# Patient Record
Sex: Female | Born: 1985 | Race: White | Hispanic: Yes | Marital: Single | Smoking: Never smoker
Health system: Southern US, Community
[De-identification: ages and names within clinical notes are randomized; demographics above are authoritative.]

## PROBLEM LIST (undated history)

## (undated) DIAGNOSIS — Z789 Other specified health status: Secondary | ICD-10-CM

## (undated) DIAGNOSIS — R87629 Unspecified abnormal cytological findings in specimens from vagina: Secondary | ICD-10-CM

## (undated) HISTORY — PX: NO PAST SURGERIES: SHX2092

## (undated) HISTORY — DX: Unspecified abnormal cytological findings in specimens from vagina: R87.629

---

## 2009-05-08 ENCOUNTER — Emergency Department (HOSPITAL_COMMUNITY): Admission: EM | Admit: 2009-05-08 | Discharge: 2009-05-08 | Payer: Self-pay | Admitting: Emergency Medicine

## 2010-09-18 ENCOUNTER — Emergency Department (HOSPITAL_COMMUNITY): Admission: EM | Admit: 2010-09-18 | Discharge: 2010-09-18 | Payer: Self-pay | Admitting: Emergency Medicine

## 2012-04-25 ENCOUNTER — Encounter: Payer: Self-pay | Admitting: Family Medicine

## 2012-11-10 ENCOUNTER — Encounter (HOSPITAL_COMMUNITY): Payer: Self-pay | Admitting: *Deleted

## 2012-11-25 ENCOUNTER — Ambulatory Visit (HOSPITAL_COMMUNITY)
Admission: RE | Admit: 2012-11-25 | Discharge: 2012-11-25 | Disposition: A | Discharge status: Home / Self Care | Payer: Self-pay | Source: Ambulatory Visit | Attending: Obstetrics and Gynecology | Admitting: Obstetrics and Gynecology

## 2012-11-25 ENCOUNTER — Encounter (HOSPITAL_COMMUNITY): Payer: Self-pay

## 2012-11-25 ENCOUNTER — Other Ambulatory Visit: Payer: Self-pay | Admitting: Obstetrics and Gynecology

## 2012-11-25 VITALS — BP 118/76 | Temp 98.7°F | Ht 62.0 in | Wt 212.6 lb

## 2012-11-25 DIAGNOSIS — N63 Unspecified lump in unspecified breast: Secondary | ICD-10-CM

## 2012-11-25 DIAGNOSIS — N6322 Unspecified lump in the left breast, upper inner quadrant: Secondary | ICD-10-CM | POA: Insufficient documentation

## 2012-11-25 DIAGNOSIS — Z1239 Encounter for other screening for malignant neoplasm of breast: Secondary | ICD-10-CM

## 2012-11-25 NOTE — Progress Notes (Signed)
Patient referred to BCCCP by the [facility] Department due to left breast/axilla lump x 2 months.  Pap Smear:    Pap smear not performed today. Patients last Pap smear was 11/07/2012 at the [facility] Department and was patient has not received results yet. Patient has a history of an abnormal Pap smear 02/03/09 ASCUS HPV- and 09/20/2011 LSIL. 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 Pap smear resultd above is in EPIC under media. Will get last Pap smear result from [facility] Department.  Physical exam: Breasts Breasts symmetrical. No skin abnormalities bilateral breasts. No nipple retraction bilateral breasts. No nipple discharge bilateral breasts. No lymphadenopathy. No lumps palpated right breast. Palpated left breast lump at 10 o'clock 12 cm from the nipple at the border of the axilla. No complaints of pain or tenderness on exam. Patient referred to the [facility] for left breast ultrasound. Appointment scheduled for Wednesday, November 26, 2012 at 1300.    Pelvic/Bimanual No Pap smear completed today since last Pap smear was 11/07/2012. Pap smear not indicated per BCCCP guidelines.

## 2012-11-25 NOTE — Patient Instructions (Signed)
Taught patient how to perform BSE and gave educational materials to take home. Patient did not need a Pap smear today due to last Pap smear was 11/07/2012. Let patient know will get result of last Pap smear from the [facility] Department and follow up will be based on result. Patient referred to the [facility] for left breast ultrasound. Appointment scheduled for Wednesday, November 26, 2012 at 1300. Patient aware of appointment and will be there. Patient verbalized understanding.

## 2012-11-26 ENCOUNTER — Ambulatory Visit
Admission: RE | Admit: 2012-11-26 | Discharge: 2012-11-26 | Disposition: A | Discharge status: Home / Self Care | Payer: No Typology Code available for payment source | Source: Ambulatory Visit | Attending: Obstetrics and Gynecology | Admitting: Obstetrics and Gynecology

## 2012-11-26 DIAGNOSIS — N63 Unspecified lump in unspecified breast: Secondary | ICD-10-CM

## 2014-10-18 ENCOUNTER — Encounter (HOSPITAL_COMMUNITY): Payer: Self-pay

## 2020-12-06 ENCOUNTER — Encounter (HOSPITAL_COMMUNITY): Payer: Self-pay | Admitting: Physician Assistant

## 2020-12-06 ENCOUNTER — Other Ambulatory Visit: Payer: Self-pay

## 2020-12-06 ENCOUNTER — Emergency Department (HOSPITAL_COMMUNITY)
Admission: EM | Admit: 2020-12-06 | Discharge: 2020-12-07 | Disposition: A | Discharge status: Home / Self Care | Payer: HRSA Program | Attending: Emergency Medicine | Admitting: Emergency Medicine

## 2020-12-06 ENCOUNTER — Emergency Department (HOSPITAL_COMMUNITY): Payer: HRSA Program

## 2020-12-06 DIAGNOSIS — R0602 Shortness of breath: Secondary | ICD-10-CM | POA: Diagnosis present

## 2020-12-06 DIAGNOSIS — U071 COVID-19: Secondary | ICD-10-CM | POA: Insufficient documentation

## 2020-12-06 LAB — CBC
HCT: 42.2 % (ref 36.0–46.0)
Hemoglobin: 14.5 g/dL (ref 12.0–15.0)
MCH: 30.7 pg (ref 26.0–34.0)
MCHC: 34.4 g/dL (ref 30.0–36.0)
MCV: 89.4 fL (ref 80.0–100.0)
Platelets: 206 10*3/uL (ref 150–400)
RBC: 4.72 MIL/uL (ref 3.87–5.11)
RDW: 11.9 % (ref 11.5–15.5)
WBC: 3.2 10*3/uL — ABNORMAL LOW (ref 4.0–10.5)
nRBC: 0 % (ref 0.0–0.2)

## 2020-12-06 LAB — BASIC METABOLIC PANEL
Anion gap: 13 (ref 5–15)
BUN: 8 mg/dL (ref 6–20)
CO2: 23 mmol/L (ref 22–32)
Calcium: 8.5 mg/dL — ABNORMAL LOW (ref 8.9–10.3)
Chloride: 98 mmol/L (ref 98–111)
Creatinine, Ser: 0.66 mg/dL (ref 0.44–1.00)
GFR, Estimated: >60 mL/min (ref >60)
Glucose, Bld: 105 mg/dL — ABNORMAL HIGH (ref 70–99)
Potassium: 3.5 mmol/L (ref 3.5–5.1)
Sodium: 134 mmol/L — ABNORMAL LOW (ref 135–145)

## 2020-12-06 LAB — I-STAT BETA HCG BLOOD, ED (MC, WL, AP ONLY): I-stat hCG, quantitative: 7.9 m[IU]/mL — ABNORMAL HIGH (ref <5)

## 2020-12-06 MED ORDER — ALBUTEROL SULFATE HFA 108 (90 BASE) MCG/ACT IN AERS: 2.0000 | INHALATION_SPRAY | RESPIRATORY_TRACT | Status: DC | PRN

## 2020-12-06 MED ORDER — ALBUTEROL SULFATE HFA 108 (90 BASE) MCG/ACT IN AERS
6.0000 | INHALATION_SPRAY | Freq: Once | RESPIRATORY_TRACT | Status: AC
  Administered 2020-12-06: 6 via RESPIRATORY_TRACT
  Filled 2020-12-06: qty 6.7

## 2020-12-06 MED ORDER — ALBUTEROL SULFATE HFA 108 (90 BASE) MCG/ACT IN AERS: 2.0000 | INHALATION_SPRAY | Freq: Once | RESPIRATORY_TRACT | Status: DC

## 2020-12-06 MED ORDER — AEROCHAMBER PLUS FLO-VU LARGE MISC
1.0000 | Freq: Once | Status: AC
  Administered 2020-12-06: 1

## 2020-12-06 NOTE — ED Triage Notes (Signed)
Pt diagnosed with covid 11/28/20, symptomatic since 11/26/20, reports increasing SHOB/CP. States she feels like she "cannot breathe" since dx. NAD noted in triage, respirations even, unlabored, VSS.

## 2020-12-06 NOTE — ED Provider Notes (Signed)
[facility] EMERGENCY DEPARTMENT Provider Note   CSN: [id number] Arrival date & time: 12/06/20  1906     History Chief Complaint  Patient presents with  . Chest Pain  . Shortness of Breath    Taylor Cochran is a 34 y.o. female.  HPI   Pt is a 34 y/o female who presents to the ED today for eval of shortness of breath. She was dx with covid about 1 week ago and has been symptomatic for 11 days. She has had a cough and has been short of breath for the last 3 days. She reports chest pain to the anterior lower chest wall that is due to cough. Reports associated diarrhea, congestion, rhinorrhea. Denies vomiting.  She has been taking tylenol without relief. She has also been taking cough medication.   History reviewed. No pertinent past medical history.  There are no problems to display for this patient.   History reviewed. No pertinent surgical history.   OB History   No obstetric history on file.     History reviewed. No pertinent family history.     Home Medications Prior to Admission medications   Not on File    Allergies    Patient has no allergy information on record.  Review of Systems   Review of Systems  Constitutional: Negative for chills and fever.  HENT: Positive for congestion and rhinorrhea. Negative for ear pain and sore throat.   Eyes: Negative for pain and visual disturbance.  Respiratory: Positive for cough and shortness of breath.   Cardiovascular: Positive for chest pain. Negative for palpitations.  Gastrointestinal: Positive for diarrhea. Negative for abdominal pain, constipation, nausea and vomiting.  Genitourinary: Negative for dysuria and hematuria.  Musculoskeletal: Negative for back pain and myalgias.  Skin: Negative for color change and rash.  Neurological: Negative for syncope and headaches.  All other systems reviewed and are negative.   Physical Exam Updated Vital Signs BP 116/81 (BP Location: Right Arm)    Pulse 98   Temp 97.7 F (36.5 C) (Oral)   Resp (!) 25   SpO2 97%   Physical Exam Vitals and nursing note reviewed.  Constitutional:      General: She is not in acute distress.    Appearance: She is well-developed and well-nourished.  HENT:     Head: Normocephalic and atraumatic.  Eyes:     Conjunctiva/sclera: Conjunctivae normal.  Cardiovascular:     Rate and Rhythm: Normal rate and regular rhythm.     Heart sounds: No murmur heard.   Pulmonary:     Effort: Pulmonary effort is normal. No respiratory distress.     Breath sounds: Normal breath sounds. No decreased breath sounds, wheezing, rhonchi or rales.  Abdominal:     General: Bowel sounds are normal.     Palpations: Abdomen is soft.     Tenderness: There is no abdominal tenderness. There is no guarding or rebound.  Musculoskeletal:        General: No edema.     Cervical back: Neck supple.  Skin:    General: Skin is warm and dry.  Neurological:     Mental Status: She is alert.  Psychiatric:        Mood and Affect: Mood and affect normal.     ED Results / Procedures / Treatments   Labs (all labs ordered are listed, but only abnormal results are displayed) Labs Reviewed  BASIC METABOLIC PANEL - Abnormal; Notable for the following components:  Result Value   Sodium 134 (*)    Glucose, Bld 105 (*)    Calcium 8.5 (*)    All other components within normal limits  CBC - Abnormal; Notable for the following components:   WBC 3.2 (*)    All other components within normal limits  I-STAT BETA HCG BLOOD, ED (MC, WL, AP ONLY) - Abnormal; Notable for the following components:   I-stat hCG, quantitative 7.9 (*)    All other components within normal limits    EKG None  Radiology DG Chest 2 View  Result Date: 12/06/2020 CLINICAL DATA:  34 year old female with shortness of breath. EXAM: CHEST - 2 VIEW COMPARISON:  None FINDINGS: Faint bilateral lower lung field confluent opacities concerning for developing  infiltrate, likely viral or atypical in etiology. Clinical correlation is recommended. No focal consolidation, pleural effusion or pneumothorax. The cardiac silhouette is within limits. No acute osseous pathology. IMPRESSION: Findings concerning for developing infiltrate, likely viral or atypical in etiology. Electronically Signed   By: Elgie Collard M.D.   On: 12/06/2020 20:09    Procedures Procedures (including critical care time)  Medications Ordered in ED Medications  AeroChamber Plus Flo-Vu Large MISC 1 each (1 each Other Given 12/06/20 2330)  albuterol (VENTOLIN HFA) 108 (90 Base) MCG/ACT inhaler 6 puff (6 puffs Inhalation Given 12/06/20 2330)    ED Course  I have reviewed the triage vital signs and the nursing notes.  Pertinent labs & imaging results that were available during my care of the patient were reviewed by me and considered in my medical decision making (see chart for details).    MDM Rules/Calculators/A&P                          Patient presenting for evaluation for Covid.  Reports symptoms ongoing for 10 days and tested positive last week.  Patient nontoxic, well-appearing, no distress. She c/o shortness of breath but Vital signs are reassuring. I personally ambulated her in the ED and sats did not drop below 94% on RA.   Chest x-ray with likely viral infiltrate secondary to covid.  Beta hcg slightly positive, suspect false positive. Remainder of labs reassuring. Advised on quarantine measures. Will give Rx for symptomatic management. Advised on f/u and return precautions. Pt voiced understanding of the plan and reasons to return. All questions answered, pt stable for d/c.  Taylor Cochran was evaluated in Emergency Department on 12/06/2020 for the symptoms described in the history of present illness. She was evaluated in the context of the global COVID-19 pandemic, which necessitated consideration that the patient might be at risk for infection with the SARS-CoV-2  virus that causes COVID-19. Institutional protocols and algorithms that pertain to the evaluation of patients at risk for COVID-19 are in a state of rapid change based on information released by regulatory bodies including the CDC and federal and state organizations. These policies and algorithms were followed during the patient's care in the ED.    Final Clinical Impression(s) / ED Diagnoses Final diagnoses:  COVID    Rx / DC Orders ED Discharge Orders    None       Karrie Meres, PA-C 12/06/20 2339    Rozelle Logan, DO 12/07/20 1511

## 2020-12-06 NOTE — Discharge Instructions (Addendum)
Take two puffs of the albuterol inhaler every 4 hours as needed for cough, shortness of breath, or wheezing.   You should be isolated for at least 7 days since the onset of your symptoms AND >72 hours after symptoms resolution (absence of fever without the use of fever reducing medication and improvement in respiratory symptoms), whichever is longer  Please follow up with your primary care provider within 5-7 days for re-evaluation of your symptoms. If you do not have a primary care provider, information for a healthcare clinic has been provided for you to make arrangements for follow up care. Please return to the emergency department for any new or worsening symptoms including shortness of breath or chest pain.  

## 2020-12-07 NOTE — ED Notes (Signed)
DC insturctions and meds reviewed with pt. Pt verbalized understanding. Pt DC

## 2020-12-17 NOTE — L&D Delivery Note (Signed)
LABOR COURSE Patient presented to L&D on 9/26 for SROM. On arrival, cervical exam was 5/60/-1. She was initially managed expectantly. Pitocin was started at 1503. She progressed to complete at 1935.  Delivery Note Called to room and patient was complete and pushing. Head delivered OA. No nuchal cord present. Shoulder and body delivered in usual fashion and easily. At  1946 a healthy female was delivered via Vaginal, Spontaneous vertex presentation.  Infant with spontaneous cry, placed on mother's abdomen, dried and stimulated. Cord clamped x 2 after 1-2-minute delay, and cut by FOB. Cord blood drawn. Placenta delivered spontaneously with gentle cord traction. Appears intact. Fundus firm with massage and Pitocin. Labia, perineum, vagina, and cervix inspected with no tears/lacerations.    APGAR: 8, 9; weight pending .   Cord: 3VC with the following complications:None .    Anesthesia:  Epidural Episiotomy: None Lacerations: None Est. Blood Loss (mL): 50  Mom to postpartum.  Baby to Couplet care / Skin to Skin.  Alicia Amel, MD 09/11/21 8:00 PM

## 2021-01-18 ENCOUNTER — Inpatient Hospital Stay (HOSPITAL_COMMUNITY): Payer: Medicaid Other

## 2021-01-18 ENCOUNTER — Encounter (HOSPITAL_COMMUNITY): Payer: Self-pay | Admitting: Obstetrics and Gynecology

## 2021-01-18 ENCOUNTER — Inpatient Hospital Stay (HOSPITAL_COMMUNITY)
Admission: AD | Admit: 2021-01-18 | Discharge: 2021-01-18 | Disposition: A | Discharge status: Home / Self Care | Payer: Medicaid Other | Attending: Obstetrics and Gynecology | Admitting: Obstetrics and Gynecology

## 2021-01-18 ENCOUNTER — Other Ambulatory Visit: Payer: Self-pay

## 2021-01-18 DIAGNOSIS — O418X10 Other specified disorders of amniotic fluid and membranes, first trimester, not applicable or unspecified: Secondary | ICD-10-CM | POA: Diagnosis not present

## 2021-01-18 DIAGNOSIS — O99611 Diseases of the digestive system complicating pregnancy, first trimester: Secondary | ICD-10-CM | POA: Insufficient documentation

## 2021-01-18 DIAGNOSIS — O208 Other hemorrhage in early pregnancy: Secondary | ICD-10-CM | POA: Insufficient documentation

## 2021-01-18 DIAGNOSIS — Z3491 Encounter for supervision of normal pregnancy, unspecified, first trimester: Secondary | ICD-10-CM

## 2021-01-18 DIAGNOSIS — O09521 Supervision of elderly multigravida, first trimester: Secondary | ICD-10-CM | POA: Insufficient documentation

## 2021-01-18 DIAGNOSIS — K219 Gastro-esophageal reflux disease without esophagitis: Secondary | ICD-10-CM | POA: Insufficient documentation

## 2021-01-18 DIAGNOSIS — R102 Pelvic and perineal pain: Secondary | ICD-10-CM | POA: Diagnosis not present

## 2021-01-18 DIAGNOSIS — O26891 Other specified pregnancy related conditions, first trimester: Secondary | ICD-10-CM | POA: Insufficient documentation

## 2021-01-18 DIAGNOSIS — K625 Hemorrhage of anus and rectum: Secondary | ICD-10-CM | POA: Insufficient documentation

## 2021-01-18 DIAGNOSIS — Z3A01 Less than 8 weeks gestation of pregnancy: Secondary | ICD-10-CM | POA: Diagnosis not present

## 2021-01-18 DIAGNOSIS — O468X1 Other antepartum hemorrhage, first trimester: Secondary | ICD-10-CM

## 2021-01-18 DIAGNOSIS — R109 Unspecified abdominal pain: Secondary | ICD-10-CM

## 2021-01-18 DIAGNOSIS — R140 Abdominal distension (gaseous): Secondary | ICD-10-CM | POA: Diagnosis not present

## 2021-01-18 HISTORY — DX: Other specified health status: Z78.9

## 2021-01-18 LAB — URINALYSIS, ROUTINE W REFLEX MICROSCOPIC
Bilirubin Urine: NEGATIVE
Glucose, UA: NEGATIVE mg/dL
Hgb urine dipstick: NEGATIVE
Ketones, ur: NEGATIVE mg/dL
Leukocytes,Ua: NEGATIVE
Nitrite: NEGATIVE
Protein, ur: NEGATIVE mg/dL
Specific Gravity, Urine: 1.015 (ref 1.005–1.030)
pH: 6 (ref 5.0–8.0)

## 2021-01-18 LAB — WET PREP, GENITAL
Clue Cells Wet Prep HPF POC: NONE SEEN
Sperm: NONE SEEN
Trich, Wet Prep: NONE SEEN
Yeast Wet Prep HPF POC: NONE SEEN

## 2021-01-18 LAB — CBC
HCT: 37.4 % (ref 36.0–46.0)
Hemoglobin: 13.1 g/dL (ref 12.0–15.0)
MCH: 32.3 pg (ref 26.0–34.0)
MCHC: 35 g/dL (ref 30.0–36.0)
MCV: 92.1 fL (ref 80.0–100.0)
Platelets: 222 10*3/uL (ref 150–400)
RBC: 4.06 MIL/uL (ref 3.87–5.11)
RDW: 13.1 % (ref 11.5–15.5)
WBC: 7 10*3/uL (ref 4.0–10.5)
nRBC: 0 % (ref 0.0–0.2)

## 2021-01-18 LAB — POCT PREGNANCY, URINE: Preg Test, Ur: POSITIVE — AB

## 2021-01-18 LAB — ABO/RH: ABO/RH(D): O POS

## 2021-01-18 LAB — HCG, QUANTITATIVE, PREGNANCY: hCG, Beta Chain, Quant, S: 48832 m[IU]/mL — ABNORMAL HIGH (ref <5)

## 2021-01-18 NOTE — Discharge Instructions (Signed)
Safe Medications in Pregnancy   Acne: Benzoyl Peroxide Salicylic Acid  Backache/Headache: Tylenol: 2 regular strength every 4 hours OR              2 Extra strength every 6 hours  Colds/Coughs/Allergies: Benadryl (alcohol free) 25 mg every 6 hours as needed Breath right strips Claritin Cepacol throat lozenges Chloraseptic throat spray Cold-Eeze- up to three times per day Cough drops, alcohol free Flonase (by prescription only) Guaifenesin Mucinex Robitussin DM (plain only, alcohol free) Saline nasal spray/drops Sudafed (pseudoephedrine) & Actifed ** use only after [redacted] weeks gestation and if you do not have high blood pressure Tylenol Vicks Vaporub Zinc lozenges Zyrtec   Constipation: Colace Ducolax suppositories Fleet enema Glycerin suppositories Metamucil Milk of magnesia Miralax Senokot Smooth move tea  Diarrhea: Kaopectate Imodium A-D  *NO pepto Bismol  Hemorrhoids: Anusol Anusol HC Preparation H Tucks  Indigestion: Tums Maalox Mylanta Zantac  Pepcid  Insomnia: Benadryl (alcohol free) 25mg  every 6 hours as needed Tylenol PM Unisom, no Gelcaps  Leg Cramps: Tums MagGel  Nausea/Vomiting:  Bonine Dramamine Emetrol Ginger extract Sea bands Meclizine  Nausea medication to take during pregnancy:  Unisom (doxylamine succinate 25 mg tablets) Take one tablet daily at bedtime. If symptoms are not adequately controlled, the dose can be increased to a maximum recommended dose of two tablets daily (1/2 tablet in the morning, 1/2 tablet mid-afternoon and one at bedtime). Vitamin B6 100mg  tablets. Take one tablet twice a day (up to 200 mg per day).  Skin Rashes: Aveeno products Benadryl cream or 25mg  every 6 hours as needed Calamine Lotion 1% cortisone cream  Yeast infection: Gyne-lotrimin 7 Monistat 7  Gum/tooth pain: Anbesol  **If taking multiple medications, please check labels to avoid duplicating the same active ingredients **take  medication as directed on the label ** Do not exceed 4000 mg of tylenol in 24 hours **Do not take medications that contain aspirin or ibuprofen     [facility] for at [facility]  [address], [place], [facility] [address]  [phone]  Center for [facility] Healthcare at [facility]  [address], [place], [facility] [address]  [phone]  Center for [facility] Healthcare at [facility] [address] [place], [facility] [address]  [phone]  Center for [facility] Healthcare at [facility]  [address] [facility] [place], [address] [place]  [phone]  Center for [facility] Healthcare at [facility]  [address] (First floor), [place], [facility] [address]  [place]  Center for [facility] [phone], [place], [facility] [place] [phone]  Center for [facility] Healthcare at [facility]  [address] [place], [place], [address] [place]  [phone]  [facility]  [address], [place], [facility] [address]  [phone]  [facility]  [address] [place], [place], [address] [place]  [phone]  [facility] Ob[facility]  [address] [phone] [place], [address] [place]  [phone]  [facility]  [address], [place], [facility] [address]  [phone]  [facility]  [address], [place], [facility] [address]  [phone]  [facility] Department   [address] [phone] [place], [address] [place]  [phone]  [facility]  [address], [place], [phone] [address]   [phone]  [facility]  [address], [place], [facility]  [phone]  Constipation, Adult Constipation is when a person has trouble pooping (having a bowel movement). When you have this condition, you may poop fewer than 3 times a week. Your poop (stool) may also be dry, hard, or bigger than normal. Follow these instructions at  home: Eating and drinking  Eat foods that have a lot of fiber, such as: ? Fresh fruits and vegetables. ? Whole grains. ? Beans.  Eat less of foods that are low in fiber and high in fat and sugar, such as: ? [place] fries. ? Hamburgers. ? Cookies. ? Candy. ? Soda.  Drink enough fluid to keep your pee (urine) pale yellow.   General instructions  Exercise regularly or as told by your doctor. Try to do 150 minutes of exercise each week.  Go to the restroom when you feel like you need to poop. Do not hold it in.  Take over-the-counter and prescription medicines only as told by your doctor. These include any fiber supplements.  When you poop: ? Do deep breathing while relaxing your lower belly (abdomen). ? Relax your pelvic floor. The pelvic floor is a group of muscles that support the rectum, bladder, and intestines (as well as the uterus in women).  Watch your condition for any changes. Tell your doctor if you notice any.  Keep all follow-up visits as told by your doctor. This is important. Contact a doctor if:  You have pain that gets worse.  You have a fever.  You have not pooped for 4 days.  You vomit.  You are not hungry.  You lose weight.  You are bleeding from the opening of the butt (anus).  You have thin, pencil-like poop. Get help right away if:  You have a fever, and your symptoms suddenly get worse.  You leak poop or have blood in your poop.  Your belly feels hard or bigger than normal (bloated).  You have very bad belly pain.  You feel dizzy or you faint. Summary  Constipation is when a person poops fewer than 3 times a week, has trouble pooping, or has poop that is dry, hard, or bigger than normal.  Eat foods that have a lot of fiber.  Drink enough fluid to keep your pee (urine) pale yellow.  Take over-the-counter and prescription medicines only as told by your doctor. These include any fiber supplements. This information is not intended to  replace advice given to you by your health care provider. Make sure you discuss any questions you have with your health care provider. Document Revised: 10/21/2019 Document Reviewed: 10/21/2019 Elsevier Patient Education  2021 [employer].

## 2021-01-18 NOTE — MAU Provider Note (Signed)
History     CSN: [id number]  Arrival date and time: 01/18/21 1451   Event Date/Time   First Provider Initiated Contact with Patient 01/18/21 1601      Chief Complaint  Patient presents with  . Abdominal Pain  . Rectal bleeding   HPI Taylor Cochran is a 35 y.o. G3P2002 at [redacted]w[redacted]d who presents with abdominal pressure, pain, and rectal bleeding. Constipation for the last 4 days. Had some bright red rectal bleeding after & small hard bowel movement. Also reports some lower abdominal cramping & constant pressure. Rates pain 3/10. Hasn't treated symptoms. No vaginal bleeding. No fever/chills. No n/v.   OB History    Gravida  3   Para  2   Term  2   Preterm  0   AB  0   Living  2     SAB  0   IAB  0   Ectopic  0   Multiple      Live Births  2           Past Medical History:  Diagnosis Date  . No pertinent past medical history     Past Surgical History:  Procedure Laterality Date  . NO PAST SURGERIES      Family History  Problem Relation Age of Onset  . Diabetes Mother     Social History   Tobacco Use  . Smoking status: Never Smoker  . Smokeless tobacco: Never Used  Substance Use Topics  . Alcohol use: No  . Drug use: No    Allergies: No Known Allergies  No medications prior to admission.    Review of Systems  Constitutional: Negative.   Gastrointestinal: Positive for abdominal pain, anal bleeding and constipation. Negative for abdominal distention, blood in stool, diarrhea, nausea, rectal pain and vomiting.  Genitourinary: Negative.    Physical Exam   Blood pressure 123/74, pulse 69, temperature 99.1 F (37.3 C), temperature source Oral, resp. rate 16, height 5\' 2"  (1.575 m), weight 89.2 kg, last menstrual period 12/02/2020, SpO2 98 %.  Physical Exam Vitals and nursing note reviewed.  Constitutional:      General: She is not in acute distress.    Appearance: She is well-developed.  HENT:     Head: Normocephalic and atraumatic.   Pulmonary:     Effort: Pulmonary effort is normal. No respiratory distress.  Abdominal:     General: Abdomen is flat. There is no distension.     Palpations: Abdomen is soft.     Tenderness: There is no abdominal tenderness.  Genitourinary:    Vagina: No bleeding.     Rectum: No anal fissure or external hemorrhoid.  Neurological:     Mental Status: She is alert.     MAU Course  Procedures Results for orders placed or performed during the hospital encounter of 01/18/21 (from the past 24 hour(s))  Pregnancy, urine POC     Status: Abnormal   Collection Time: 01/18/21  3:05 PM  Result Value Ref Range   Preg Test, Ur POSITIVE (A) NEGATIVE  Urinalysis, Routine w reflex microscopic Urine, Clean Catch     Status: Abnormal   Collection Time: 01/18/21  3:15 PM  Result Value Ref Range   Color, Urine YELLOW YELLOW   APPearance HAZY (A) CLEAR   Specific Gravity, Urine 1.015 1.005 - 1.030   pH 6.0 5.0 - 8.0   Glucose, UA NEGATIVE NEGATIVE mg/dL   Hgb urine dipstick NEGATIVE NEGATIVE   Bilirubin Urine NEGATIVE NEGATIVE  Ketones, ur NEGATIVE NEGATIVE mg/dL   Protein, ur NEGATIVE NEGATIVE mg/dL   Nitrite NEGATIVE NEGATIVE   Leukocytes,Ua NEGATIVE NEGATIVE  CBC     Status: None   Collection Time: 01/18/21  4:34 PM  Result Value Ref Range   WBC 7.0 4.0 - 10.5 K/uL   RBC 4.06 3.87 - 5.11 MIL/uL   Hemoglobin 13.1 12.0 - 15.0 g/dL   HCT 16.1 09.6 - 04.5 %   MCV 92.1 80.0 - 100.0 fL   MCH 32.3 26.0 - 34.0 pg   MCHC 35.0 30.0 - 36.0 g/dL   RDW 40.9 81.1 - 91.4 %   Platelets 222 150 - 400 K/uL   nRBC 0.0 0.0 - 0.2 %  hCG, quantitative, pregnancy     Status: Abnormal   Collection Time: 01/18/21  4:34 PM  Result Value Ref Range   hCG, Beta Chain, Quant, S 78,295 (H) <5 mIU/mL  ABO/Rh     Status: None   Collection Time: 01/18/21  4:38 PM  Result Value Ref Range   ABO/RH(D) O POS    No rh immune globuloin      NOT A RH IMMUNE GLOBULIN CANDIDATE, PT RH POSITIVE Performed at [facility], [address], [place], [place] [address]   Wet prep, genital     Status: Abnormal   Collection Time: 01/18/21  4:39 PM   Specimen: PATH Cytology Cervicovaginal Ancillary Only  Result Value Ref Range   Yeast Wet Prep HPF POC NONE SEEN NONE SEEN   Trich, Wet Prep NONE SEEN NONE SEEN   Clue Cells Wet Prep HPF POC NONE SEEN NONE SEEN   WBC, Wet Prep HPF POC MODERATE (A) NONE SEEN   Sperm NONE SEEN    US OB LESS THAN 14 WEEKS WITH OB TRANSVAGINAL  Result Date: 01/18/2021 CLINICAL DATA:  Pain in early pregnancy. EXAM: OBSTETRIC <14 WK [place] AND TRANSVAGINAL OB US TECHNIQUE: Both transabdominal and transvaginal ultrasound examinations were performed for complete evaluation of the gestation as well as the maternal uterus, adnexal regions, and pelvic cul-de-sac. Transvaginal technique was performed to assess early pregnancy. COMPARISON:  None. FINDINGS: Intrauterine gestational sac: Single Yolk sac:  Visualized Embryo:  Visualize Cardiac Activity: Visualized Heart Rate: 111 bpm CRL:  5.2 mm   6 w   1 d                  [place] EDC: 09/12/2021 Maternal uterus/adnexae: Subchorionic hemorrhage: Small Right ovary: Normal Left ovary: Normal Other :None Free fluid:  None IMPRESSION: 1. Single living intrauterine gestation is visualized. The estimated gestational age is 6 weeks and 1 day. 2. Small subchorionic hemorrhage noted Electronically Signed   By: Signa Kell M.D.   On: 01/18/2021 18:18    MDM +UPT UA, wet prep, GC/chlamydia, CBC, ABO/Rh, quant hCG, and [place] today to rule out ectopic pregnancy which can be life threatening.   Ultrasound shows live IUP. Small subchorionic hemorrhage. Reviewed results with patient.   Rectal bleeding not currently occurring and benign exam; suspect internal hemorrhoid or small fissure related to constipation. Discussed reasons to seek further evaluation and discuss treatment of constipation.   Assessment and Plan   1. Normal IUP (intrauterine pregnancy) on prenatal  ultrasound, first trimester   2. Abdominal pain during pregnancy in first trimester   3. Subchorionic hematoma in first trimester, single or unspecified fetus   4. [redacted] weeks gestation of pregnancy    -start prenatal care -reviewed bleeding/misccarriage precautions -given list  of OTC meds safe in pregnancy -discussed treatment of constipaiton  Judeth Horn 01/18/2021, 7:01 PM

## 2021-01-18 NOTE — MAU Provider Note (Addendum)
Chief Complaint:  Abdominal Pain and Rectal bleeding   Event Date/Time   First Provider Initiated Contact with Patient 01/18/21 1601      HPI: Taylor Cochran is a 35 y.o. G3P2002 at [redacted]w[redacted]d by LMP who presents to maternity admissions reporting abdominal pain and rectal bleeding. Patient reports that she was constipated x4 days ago and when passing hard, "pebble-like" stools, noticed frank, bright red blood "as much as a period" coming from her rectum. The constipation has since resolved, but she is still having some, but less rectal bleeding. She was seen at an urgent care today and had a positive hemoccult test and was sent here. She also reports that two days ago she started experiencing abdominal pain, it was not sharp, but feels like pressure "bloating and gassy" and it has not resolved. The pain is most severe in the RLQ. She took and alka-seltzer tablet which helped some. She reports a hx of acid reflux which is controlled with Tums. She denies vaginal bleeding,  urinary symptoms, h/a, dizziness, n/v, or fever/chills.     Past Medical History: History reviewed. No pertinent past medical history.  Past obstetric history: OB History  Gravida Para Term Preterm AB Living  3 2 2  0 0 2  SAB IAB Ectopic Multiple Live Births  0 0 0   2    # Outcome Date GA Lbr Len/2nd Weight Sex Delivery Anes PTL Lv  3 Current           2 Term      Vag-Spont   LIV  1 Term      Vag-Spont   LIV    Past Surgical History: History reviewed. No pertinent surgical history.  Family History: Family History  Problem Relation Age of Onset   Diabetes Mother     Social History: Social History   Tobacco Use   Smoking status: Never Smoker   Smokeless tobacco: Never Used  Substance Use Topics   Alcohol use: No   Drug use: No    Allergies: No Known Allergies  Meds:  No medications prior to admission.    ROS:  Review of Systems  Constitutional: Negative for fever.  Gastrointestinal: Positive for  abdominal distention (bloating, gassy, pressure), abdominal pain, anal bleeding and blood in stool. Negative for nausea and vomiting.  Genitourinary: Negative for difficulty urinating and dysuria.    I have reviewed patient's Past Medical Hx, Surgical Hx, Family Hx, Social Hx, medications and allergies.   Physical Exam   Patient Vitals for the past 24 hrs:  BP Temp Temp src Pulse Resp SpO2 Height Weight  01/18/21 1523 123/74 99.1 F (37.3 C) Oral 69 16 98 % 5\' 2"  (1.575 m) 89.2 kg   Constitutional: Well-developed, well-nourished female in no acute distress.  Cardiovascular: normal rate Respiratory: normal effort GI: Abd soft, RLQ tenderness.  MS: Extremities nontender, no edema, normal ROM Neurologic: Alert and oriented x 4.     Labs: Results for orders placed or performed during the hospital encounter of 01/18/21 (from the past 24 hour(s))  Pregnancy, urine POC     Status: Abnormal   Collection Time: 01/18/21  3:05 PM  Result Value Ref Range   Preg Test, Ur POSITIVE (A) NEGATIVE  Urinalysis, Routine w reflex microscopic Urine, Clean Catch     Status: Abnormal   Collection Time: 01/18/21  3:15 PM  Result Value Ref Range   Color, Urine YELLOW YELLOW   APPearance HAZY (A) CLEAR   Specific Gravity, Urine 1.015  1.005 - 1.030   pH 6.0 5.0 - 8.0   Glucose, UA NEGATIVE NEGATIVE mg/dL   Hgb urine dipstick NEGATIVE NEGATIVE   Bilirubin Urine NEGATIVE NEGATIVE   Ketones, ur NEGATIVE NEGATIVE mg/dL   Protein, ur NEGATIVE NEGATIVE mg/dL   Nitrite NEGATIVE NEGATIVE   Leukocytes,Ua NEGATIVE NEGATIVE      Imaging:  US OB LESS THAN 14 WEEKS WITH OB TRANSVAGINAL  Result Date: 01/18/2021 CLINICAL DATA:  Pain in early pregnancy. EXAM: OBSTETRIC <14 WK [place] AND TRANSVAGINAL OB US TECHNIQUE: Both transabdominal and transvaginal ultrasound examinations were performed for complete evaluation of the gestation as well as the maternal uterus, adnexal regions, and pelvic cul-de-sac. Transvaginal  technique was performed to assess early pregnancy. COMPARISON:  None. FINDINGS: Intrauterine gestational sac: Single Yolk sac:  Visualized Embryo:  Visualize Cardiac Activity: Visualized Heart Rate: 111 bpm CRL:  5.2 mm   6 w   1 d                  [place] EDC: 09/12/2021 Maternal uterus/adnexae: Subchorionic hemorrhage: Small Right ovary: Normal Left ovary: Normal Other :None Free fluid:  None IMPRESSION: 1. Single living intrauterine gestation is visualized. The estimated gestational age is 6 weeks and 1 day. 2. Small subchorionic hemorrhage noted Electronically Signed   By: Signa Kell M.D.   On: 01/18/2021 18:18    MAU Course/MDM: Orders Placed This Encounter  Procedures   Wet prep, genital   US OB LESS THAN 14 WEEKS WITH OB TRANSVAGINAL   Urinalysis, Routine w reflex microscopic Urine, Clean Catch   CBC   hCG, quantitative, pregnancy   Diet NPO time specified   Pregnancy, urine POC   ABO/Rh    No orders of the defined types were placed in this encounter.    Assessment: 1. Normal IUP (intrauterine pregnancy) on prenatal ultrasound, first trimester   2. Abdominal pain during pregnancy in first trimester   3. Subchorionic hematoma in first trimester, single or unspecified fetus   4. [redacted] weeks gestation of pregnancy    [place] and labs reviewed IUP visualized with small subchorionic hemorrhage. Rectal bleeding likely due to internal hemorrhoids, stable H&H, patient educated on precautions for further evaluation. Other symptoms such as bloating and gas likely due to normal pregnancy in setting of normal exam. Patient educated on OTC medications safe to take during pregnancy for control of these symptoms.    Plan: Discharge home Patient educated on ED precautions and instructed to establish prenatal care and follow-up with outpatient OBGYN.    Allergies as of 01/18/2021   No Known Allergies      Medication List    You have not been prescribed any medications.     Ileana Roup,  Student-PA 01/18/2021 4:37 PM   I confirm that I have verified and agree with the information documented in the PA student's note.   Please see my note for full documentation of this encounter.  Judeth Horn, NP 01/18/2021 7:40 PM

## 2021-01-18 NOTE — MAU Note (Signed)
Taylor Cochran is a 35 y.o. at [redacted]w[redacted]d here in MAU reporting: for the past 4 days has been having rectal bleeding with bowel movements. States she has been constipated and since she was finally able to have a BM she has seen the bleeding. Also having abdominal pain that has been going on since Monday. Pain feels like pressure that is constant. No vaginal bleeding or discharge.  LMP: 12/02/20  Onset of complaint: ongoing  Pain score: 3/10  Vitals:   01/18/21 1523  BP: 123/74  Pulse: 69  Resp: 16  Temp: 99.1 F (37.3 C)  SpO2: 98%     Lab orders placed from triage: upt, ua

## 2021-01-20 LAB — GC/CHLAMYDIA PROBE AMP (~~LOC~~) NOT AT ARMC
Chlamydia: NEGATIVE
Comment: NEGATIVE
Comment: NORMAL
Neisseria Gonorrhea: NEGATIVE

## 2021-03-01 ENCOUNTER — Encounter: Payer: Self-pay | Admitting: Obstetrics & Gynecology

## 2021-03-01 ENCOUNTER — Other Ambulatory Visit (HOSPITAL_COMMUNITY)
Admission: RE | Admit: 2021-03-01 | Discharge: 2021-03-01 | Disposition: A | Discharge status: Home / Self Care | Payer: Medicaid Other | Source: Ambulatory Visit | Attending: Obstetrics & Gynecology | Admitting: Obstetrics & Gynecology

## 2021-03-01 ENCOUNTER — Ambulatory Visit (INDEPENDENT_AMBULATORY_CARE_PROVIDER_SITE_OTHER): Payer: Medicaid Other | Admitting: Obstetrics & Gynecology

## 2021-03-01 ENCOUNTER — Other Ambulatory Visit: Payer: Self-pay

## 2021-03-01 VITALS — BP 115/75 | HR 87 | Wt 199.0 lb

## 2021-03-01 DIAGNOSIS — Z3480 Encounter for supervision of other normal pregnancy, unspecified trimester: Secondary | ICD-10-CM | POA: Diagnosis not present

## 2021-03-01 DIAGNOSIS — Z3A12 12 weeks gestation of pregnancy: Secondary | ICD-10-CM

## 2021-03-01 DIAGNOSIS — O99210 Obesity complicating pregnancy, unspecified trimester: Secondary | ICD-10-CM

## 2021-03-01 DIAGNOSIS — O99891 Other specified diseases and conditions complicating pregnancy: Secondary | ICD-10-CM

## 2021-03-01 DIAGNOSIS — Z283 Underimmunization status: Secondary | ICD-10-CM

## 2021-03-01 DIAGNOSIS — O09529 Supervision of elderly multigravida, unspecified trimester: Secondary | ICD-10-CM

## 2021-03-01 DIAGNOSIS — Z2839 Other underimmunization status: Secondary | ICD-10-CM

## 2021-03-01 NOTE — Patient Instructions (Signed)
Pregnancy After Age 37 Women who become pregnant after the age of 37 have a higher risk for certain problems during pregnancy. This is because older women may already have health problems before becoming pregnant. Older women who are healthy before pregnancy may still develop problems during pregnancy. These problems may affect the mother, the unborn baby (fetus), or both. How does this affect me? If you are over age 37 and you want to become pregnant or are pregnant, you may have a higher risk of:  Not being able to get pregnant (infertility).  Going into labor early (preterm labor).  Needing surgical delivery of your baby (cesarean delivery, or C-section).  Having complications during pregnancy, such as high blood pressure and other symptoms (preeclampsia).  Having diabetes during pregnancy (gestational diabetes).  Being pregnant with more than one baby.  Loss of the unborn baby before 20 weeks (miscarriage) or after 20 weeks of pregnancy (stillbirth). How does this affect my baby? Babies born to women over the age of 37 have a higher risk for:  Being born early (prematurity).  Low birth weight, which is less than 5 lb, 8 oz (2.5 kg).  Birth defects, such as Down syndrome and cleft palate.  Health complications, including problems with growth and development. Prenatal care All women should see their health care provider before they try to become pregnant. This is especially important for women over the age of 37. Tell your health care provider about:  Any health problems you have.  Any medicines you take.  Any family history of health problems or chromosome-related defects.  Any problems you have had with past surgeries, pregnancies, or deliveries. If you are over age 37 and you plan to become pregnant, start taking a daily multivitamin a month or more before you try to get pregnant. Your multivitamin should contain 400 mcg (micrograms) of folic acid. If you are over age 37  and pregnant, make sure you:  Keep taking your multivitamin unless your health care provider tells you not to take it.  Keep all follow-up visits. This includes prenatal visits. This is important.  Talk with your health care provider about other prenatal screening tests that you may need. Prenatal tests Screening tests show whether your baby has a higher risk for birth defects than other babies. Screening tests include:  More frequent ultrasound tests to look for markers that indicate a risk for birth defects.  Maternal blood screening. These are blood tests that help to determine your baby's risk for birth defects. Screening tests do not show whether your baby has or does not have birth defects. They only show your baby's risk for certain birth defects. If your screening tests show that risk factors are present, you may need tests to confirm the birth defect (diagnostic testing). These tests may include:  Chorionic villus sampling. For this procedure, a tissue sample is taken from the organ that forms in your uterus to nourish your baby (placenta). The sample is removed through your cervix or abdomen and tested.  Amniocentesis. For this procedure, a small amount of the fluid that surrounds the baby in the uterus (amniotic fluid) is removed and tested. You will also need screenings tests for gestational diabetes in the first trimester, as well as later in pregnancy. Staying healthy during pregnancy Staying healthy during pregnancy can help you and your baby to have a lower risk for problems during pregnancy, during delivery, or both. Talk with your health care provider for specific instructions about staying healthy during   your pregnancy. General tips Nutrition  At each meal, eat a variety of foods from each of the five food groups. These groups include: ? Proteins such as lean meats, poultry, fish that is low in fat, beans, eggs, and nuts. ? Vegetables such as leafy greens, raw and cooked  vegetables, and vegetable juice. ? Fruits that are fresh, frozen, or canned, or 100% fruit juice. ? Dairy products such as low-fat yogurt, cheese, and milk. ? Whole grains including rice, cereal, pasta, and bread.  Follow instructions from your health care provider about eating and drinking restrictions during pregnancy. ? Do not eat raw eggs, raw meat, or raw fish or seafood. ? Do not eat any fish that contains high amounts of mercury, such as swordfish or mackerel.   Managing weight gain  Ask your health care provider how much weight gain is healthy during pregnancy.  Stay at a healthy weight. If needed, work with your health care provider to lose weight safely.  Exercise regularly, as directed by your health care provider. Ask your health care provider what forms of exercise are safe for you. Follow these instructions at home:  Do not use any products that contain nicotine or tobacco. These products include cigarettes, chewing tobacco, and vaping devices, such as e-cigarettes. If you need help quitting, ask your health care provider.  Drink enough fluid to keep your urine pale yellow.  Do not drink alcohol, use drugs, or abuse prescription medicine.  Take over-the-counter and prescription medicines only as told by your health care provider.  Do not use hot tubs, steam rooms, or saunas.  Talk with your health care provider about your risk of exposure to harmful environmental conditions. This includes exposure to chemicals, radiation, cleaning products, and cat feces. Follow advice from your health care provider about how to limit your exposure. Summary  Women who become pregnant after the age of 37 have a higher risk for complications during pregnancy.  Problems may affect the mother, the unborn baby (fetus), or both.  All women should see their health care provider before they try to become pregnant. This is especially important for women over the age of 37.  Staying healthy  during pregnancy can help both you and your baby to have a lower risk for some of the problems that can happen during pregnancy, during delivery, or both. This information is not intended to replace advice given to you by your health care provider. Make sure you discuss any questions you have with your health care provider. Document Revised: 08/22/2020 Document Reviewed: 08/22/2020 Elsevier Patient Education  2021 [employer]vier Inc.  

## 2021-03-01 NOTE — Progress Notes (Signed)
NOB, reports no problems today. 

## 2021-03-01 NOTE — Progress Notes (Signed)
  Subjective:    Taylor Cochran is a [id number] [redacted]w[redacted]d being seen today for her first obstetrical visit.  Her obstetrical history is significant for advanced maternal age and obesity. Patient does intend to breast feed. Pregnancy history fully reviewed.  Patient reports nausea.  Vitals:   03/01/21 1442  BP: 115/75  Pulse: 87  Weight: 199 lb (90.3 kg)    HISTORY: OB History  Gravida Para Term Preterm AB Living  3 2 2  0 0 2  SAB IAB Ectopic Multiple Live Births  0 0 0   2    # Outcome Date GA Lbr Len/2nd Weight Sex Delivery Anes PTL Lv  3 Current           2 Term 06/14/07 [redacted]w[redacted]d  7 lb 10 oz (3.459 kg)  Vag-Spont   LIV  1 Term      Vag-Spont   LIV   Past Medical History:  Diagnosis Date  . No pertinent past medical history   . Vaginal Pap smear, abnormal    Past Surgical History:  Procedure Laterality Date  . NO PAST SURGERIES     Family History  Problem Relation Age of Onset  . Diabetes Mother      Exam    Uterus:     Pelvic Exam:    Perineum: Hemorrhoids   Vulva: normal   Vagina:  normal mucosa   pH:     Cervix: no lesions   Adnexa: normal adnexa   Bony Pelvis: gynecoid  System: Breast:  normal appearance, no masses or tenderness   Skin: normal coloration and turgor, no rashes    Neurologic: oriented, normal mood   Extremities: normal strength, tone, and muscle mass   HEENT PERRLA and sclera clear, anicteric   Mouth/Teeth mucous membranes moist, pharynx normal without lesions   Neck supple   Cardiovascular: regular rate and rhythm   Respiratory:  appears well, vitals normal, no respiratory distress, acyanotic, normal RR, neck free of mass or lymphadenopathy   Abdomen: soft, non-tender; bowel sounds normal; no masses,  no organomegaly   Urinary: urethral meatus normal      Assessment:    Pregnancy: [redacted]w[redacted]d Patient Active Problem List   Diagnosis Date Noted  . Supervision of other normal pregnancy, antepartum 03/01/2021  . Antepartum multigravida  of advanced maternal age 26/16/2022  . Obesity affecting pregnancy, antepartum 03/01/2021  . Breast lump on left side at 10 o'clock position 11/25/2012        Plan:     Initial labs drawn. Prenatal vitamins. Problem list reviewed and updated. Genetic Screening discussed   Ultrasound discussed; fetal survey: ordered.  Follow up in 4 weeks. 50% of 30 min visit spent on counseling and coordination of care.     14/09/2012 03/01/2021

## 2021-03-02 LAB — CBC/D/PLT+RPR+RH+ABO+RUB AB...
Antibody Screen: NEGATIVE
Basophils Absolute: 0 10*3/uL (ref 0.0–0.2)
Basos: 1 %
EOS (ABSOLUTE): 0.1 10*3/uL (ref 0.0–0.4)
Eos: 1 %
HCV Ab: <0.1 s/co ratio (ref 0.0–0.9)
HIV Screen 4th Generation wRfx: NONREACTIVE
Hematocrit: 37.8 % (ref 34.0–46.6)
Hemoglobin: 13 g/dL (ref 11.1–15.9)
Hepatitis B Surface Ag: NEGATIVE
Immature Grans (Abs): 0 10*3/uL (ref 0.0–0.1)
Immature Granulocytes: 0 %
Lymphocytes Absolute: 2.1 10*3/uL (ref 0.7–3.1)
Lymphs: 32 %
MCH: 31.6 pg (ref 26.6–33.0)
MCHC: 34.4 g/dL (ref 31.5–35.7)
MCV: 92 fL (ref 79–97)
Monocytes Absolute: 0.4 10*3/uL (ref 0.1–0.9)
Monocytes: 6 %
Neutrophils Absolute: 4 10*3/uL (ref 1.4–7.0)
Neutrophils: 60 %
Platelets: 233 10*3/uL (ref 150–450)
RBC: 4.11 x10E6/uL (ref 3.77–5.28)
RDW: 12.7 % (ref 11.7–15.4)
RPR Ser Ql: NONREACTIVE
Rh Factor: POSITIVE
Rubella Antibodies, IGG: <0.9 index — ABNORMAL LOW (ref >0.99)
WBC: 6.7 10*3/uL (ref 3.4–10.8)

## 2021-03-02 LAB — HEMOGLOBIN A1C
Est. average glucose Bld gHb Est-mCnc: 100 mg/dL
Hgb A1c MFr Bld: 5.1 % (ref 4.8–5.6)

## 2021-03-02 LAB — HCV INTERPRETATION

## 2021-03-03 LAB — CULTURE, OB URINE

## 2021-03-03 LAB — URINE CULTURE, OB REFLEX

## 2021-03-06 DIAGNOSIS — O09899 Supervision of other high risk pregnancies, unspecified trimester: Secondary | ICD-10-CM | POA: Insufficient documentation

## 2021-03-06 DIAGNOSIS — Z2839 Other underimmunization status: Secondary | ICD-10-CM | POA: Insufficient documentation

## 2021-03-06 DIAGNOSIS — O99891 Other specified diseases and conditions complicating pregnancy: Secondary | ICD-10-CM | POA: Insufficient documentation

## 2021-03-06 LAB — CYTOLOGY - PAP
Comment: NEGATIVE
Comment: NEGATIVE
Diagnosis: UNDETERMINED — AB
HPV 16: NEGATIVE
HPV 18 / 45: NEGATIVE
High risk HPV: POSITIVE — AB

## 2021-03-07 ENCOUNTER — Encounter: Payer: Self-pay | Admitting: Obstetrics & Gynecology

## 2021-03-13 ENCOUNTER — Encounter: Payer: Self-pay | Admitting: Obstetrics & Gynecology

## 2021-03-13 ENCOUNTER — Other Ambulatory Visit: Payer: Self-pay

## 2021-03-13 ENCOUNTER — Telehealth: Payer: Self-pay

## 2021-03-13 MED ORDER — PREPLUS 27-1 MG PO TABS: 1.0000 | ORAL_TABLET | Freq: Every day | ORAL | 13 refills | Status: AC

## 2021-03-13 NOTE — Telephone Encounter (Signed)
Received called from patient she reports having a metallic taste with anything she eats or drinks. She would like to know if this is normal and what can she take to help.  I have advised patient the metallic taste should resolve on its on. Some things to try: Switch  tooth brush Chew on mints and continue to eat and drink as normal.  Drink sprite or ginger ale.  If symptoms get worse please call the office for an appointment.

## 2021-03-30 ENCOUNTER — Ambulatory Visit (INDEPENDENT_AMBULATORY_CARE_PROVIDER_SITE_OTHER): Payer: Medicaid Other | Admitting: Obstetrics and Gynecology

## 2021-03-30 ENCOUNTER — Encounter: Payer: Self-pay | Admitting: Obstetrics and Gynecology

## 2021-03-30 ENCOUNTER — Other Ambulatory Visit: Payer: Self-pay

## 2021-03-30 VITALS — BP 126/79 | HR 74 | Wt 196.0 lb

## 2021-03-30 DIAGNOSIS — O09529 Supervision of elderly multigravida, unspecified trimester: Secondary | ICD-10-CM

## 2021-03-30 DIAGNOSIS — Z3A16 16 weeks gestation of pregnancy: Secondary | ICD-10-CM

## 2021-03-30 DIAGNOSIS — Z2839 Other underimmunization status: Secondary | ICD-10-CM

## 2021-03-30 DIAGNOSIS — O99210 Obesity complicating pregnancy, unspecified trimester: Secondary | ICD-10-CM

## 2021-03-30 DIAGNOSIS — Z3480 Encounter for supervision of other normal pregnancy, unspecified trimester: Secondary | ICD-10-CM

## 2021-03-30 MED ORDER — ASPIRIN EC 81 MG PO TBEC: 81.0000 mg | DELAYED_RELEASE_TABLET | Freq: Every day | ORAL | 2 refills | Status: DC

## 2021-03-30 NOTE — Progress Notes (Signed)
Pt would like to discuss weight. Pt states that she is having issues with smells and taste of certain foods.  Pt states that this is making it hard to eat.

## 2021-03-30 NOTE — Progress Notes (Signed)
   PRENATAL VISIT NOTE  Subjective:  Taylor Cochran is a 35 y.o. G3P2002 at [redacted]w[redacted]d being seen today for ongoing prenatal care.  She is currently monitored for the following issues for this low-risk pregnancy and has Breast lump on left side at 10 o'clock position; Supervision of other normal pregnancy, antepartum; Antepartum multigravida of advanced maternal age; Obesity affecting pregnancy, antepartum; and Rubella non-immune status, antepartum on their problem list.  Patient reports having a hard time eating, everything tastes/smells bad, particularly since she had COVID. Worried about weight gain.  Contractions: Not present. Vag. Bleeding: None.  Movement: Present. Denies leaking of fluid.   The following portions of the patient's history were reviewed and updated as appropriate: allergies, current medications, past family history, past medical history, past social history, past surgical history and problem list.   Objective:   Vitals:   03/30/21 1619  BP: 126/79  Pulse: 74  Weight: 196 lb (88.9 kg)    Fetal Status: Fetal Heart Rate (bpm): 140   Movement: Present     General:  Alert, oriented and cooperative. Patient is in no acute distress.  Skin: Skin is warm and dry. No rash noted.   Cardiovascular: Normal heart rate noted  Respiratory: Normal respiratory effort, no problems with respiration noted  Abdomen: Soft, gravid, appropriate for gestational age.  Pain/Pressure: Absent     Pelvic: Cervical exam deferred        Extremities: Normal range of motion.     Mental Status: Normal mood and affect. Normal behavior. Normal judgment and thought content.   Assessment and Plan:  Pregnancy: G3P2002 at [redacted]w[redacted]d  1. Antepartum multigravida of advanced maternal age - Reviewed weight course as she is having a hard time eating - [phone], recommend she supplement with Ensure for calories but emphasized to just eat when she can and not stress, that most weight gain will come in 3rd  trimester  2. Obesity affecting pregnancy, antepartum Start baby ASA  3. Rubella non-immune status, antepartum MMR pp  4. Supervision of other normal pregnancy, antepartum  5. [redacted] weeks gestation of pregnancy   Preterm labor symptoms and general obstetric precautions including but not limited to vaginal bleeding, contractions, leaking of fluid and fetal movement were reviewed in detail with the patient. Please refer to After Visit Summary for other counseling recommendations.   Return in about 4 weeks (around 04/27/2021) for low OB, in person.  Future Appointments  Date Time Provider [facility]  04/14/2021  9:30 AM [facility] NURSE [facility]  04/14/2021  9:45 AM [facility]    Sloan Leiter, MD

## 2021-04-14 ENCOUNTER — Encounter: Payer: Self-pay | Admitting: *Deleted

## 2021-04-14 ENCOUNTER — Ambulatory Visit: Payer: Medicaid Other | Attending: Obstetrics & Gynecology

## 2021-04-14 ENCOUNTER — Other Ambulatory Visit: Payer: Self-pay | Admitting: *Deleted

## 2021-04-14 ENCOUNTER — Other Ambulatory Visit: Payer: Self-pay

## 2021-04-14 ENCOUNTER — Ambulatory Visit: Payer: Medicaid Other | Admitting: *Deleted

## 2021-04-14 DIAGNOSIS — Z6841 Body Mass Index (BMI) 40.0 and over, adult: Secondary | ICD-10-CM

## 2021-04-14 DIAGNOSIS — Z363 Encounter for antenatal screening for malformations: Secondary | ICD-10-CM

## 2021-04-14 DIAGNOSIS — Z3A19 19 weeks gestation of pregnancy: Secondary | ICD-10-CM | POA: Diagnosis not present

## 2021-04-14 DIAGNOSIS — Z6834 Body mass index (BMI) 34.0-34.9, adult: Secondary | ICD-10-CM | POA: Diagnosis not present

## 2021-04-14 DIAGNOSIS — O09522 Supervision of elderly multigravida, second trimester: Secondary | ICD-10-CM

## 2021-04-14 DIAGNOSIS — Z3480 Encounter for supervision of other normal pregnancy, unspecified trimester: Secondary | ICD-10-CM

## 2021-04-14 DIAGNOSIS — O99212 Obesity complicating pregnancy, second trimester: Secondary | ICD-10-CM

## 2021-04-28 ENCOUNTER — Encounter: Payer: Self-pay | Admitting: Obstetrics

## 2021-04-28 ENCOUNTER — Other Ambulatory Visit: Payer: Self-pay

## 2021-04-28 ENCOUNTER — Ambulatory Visit (INDEPENDENT_AMBULATORY_CARE_PROVIDER_SITE_OTHER): Payer: Medicaid Other | Admitting: Obstetrics

## 2021-04-28 VITALS — BP 124/79 | HR 84 | Wt 196.0 lb

## 2021-04-28 DIAGNOSIS — O99210 Obesity complicating pregnancy, unspecified trimester: Secondary | ICD-10-CM

## 2021-04-28 DIAGNOSIS — Z3480 Encounter for supervision of other normal pregnancy, unspecified trimester: Secondary | ICD-10-CM

## 2021-04-28 DIAGNOSIS — O09529 Supervision of elderly multigravida, unspecified trimester: Secondary | ICD-10-CM

## 2021-04-28 NOTE — Progress Notes (Signed)
Pt states she is doing well. 

## 2021-04-28 NOTE — Progress Notes (Signed)
Subjective:  Taylor Cochran is a 35 y.o. G3P2002 at [redacted]w[redacted]d being seen today for ongoing prenatal care.  She is currently monitored for the following issues for this low-risk pregnancy and has Breast lump on left side at 10 o'clock position; Supervision of other normal pregnancy, antepartum; Antepartum multigravida of advanced maternal age; Obesity affecting pregnancy, antepartum; and Rubella non-immune status, antepartum on their problem list.  Patient reports backache and heartburn.  Contractions: Not present. Vag. Bleeding: None.  Movement: Present. Denies leaking of fluid.   The following portions of the patient's history were reviewed and updated as appropriate: allergies, current medications, past family history, past medical history, past social history, past surgical history and problem list. Problem list updated.  Objective:   Vitals:   04/28/21 1012  BP: 124/79  Pulse: 84  Weight: 196 lb (88.9 kg)    Fetal Status:     Movement: Present     General:  Alert, oriented and cooperative. Patient is in no acute distress.  Skin: Skin is warm and dry. No rash noted.   Cardiovascular: Normal heart rate noted  Respiratory: Normal respiratory effort, no problems with respiration noted  Abdomen: Soft, gravid, appropriate for gestational age. Pain/Pressure: Absent     Pelvic:  Cervical exam deferred        Extremities: Normal range of motion.     Mental Status: Normal mood and affect. Normal behavior. Normal judgment and thought content.   Urinalysis:      Assessment and Plan:  Pregnancy: G3P2002 at [redacted]w[redacted]d  1. Supervision of other normal pregnancy, antepartum  2. Antepartum multigravida of advanced maternal age  59. Obesity affecting pregnancy, antepartum   Preterm labor symptoms and general obstetric precautions including but not limited to vaginal bleeding, contractions, leaking of fluid and fetal movement were reviewed in detail with the patient. Please refer to After Visit  Summary for other counseling recommendations.   Return in about 4 weeks (around 05/26/2021) for ROB.   Brock Bad, MD  04/28/21

## 2021-04-30 LAB — AFP, SERUM, OPEN SPINA BIFIDA
AFP MoM: 1.47
AFP Value: 85.1 ng/mL
Gest. Age on Collection Date: 21 weeks
Maternal Age At EDD: 37.1 yr
OSBR Risk 1 IN: 2965
Test Results:: NEGATIVE
Weight: 196 [lb_av]

## 2021-05-12 ENCOUNTER — Encounter: Payer: Self-pay | Admitting: *Deleted

## 2021-05-12 ENCOUNTER — Other Ambulatory Visit: Payer: Self-pay | Admitting: Obstetrics and Gynecology

## 2021-05-12 ENCOUNTER — Ambulatory Visit: Payer: Medicaid Other | Admitting: *Deleted

## 2021-05-12 ENCOUNTER — Other Ambulatory Visit: Payer: Self-pay

## 2021-05-12 ENCOUNTER — Ambulatory Visit: Payer: Medicaid Other | Attending: Obstetrics

## 2021-05-12 DIAGNOSIS — E669 Obesity, unspecified: Secondary | ICD-10-CM

## 2021-05-12 DIAGNOSIS — Z3480 Encounter for supervision of other normal pregnancy, unspecified trimester: Secondary | ICD-10-CM

## 2021-05-12 DIAGNOSIS — O09522 Supervision of elderly multigravida, second trimester: Secondary | ICD-10-CM

## 2021-05-12 DIAGNOSIS — Z3A23 23 weeks gestation of pregnancy: Secondary | ICD-10-CM | POA: Diagnosis not present

## 2021-05-12 DIAGNOSIS — O09523 Supervision of elderly multigravida, third trimester: Secondary | ICD-10-CM

## 2021-05-12 DIAGNOSIS — Z362 Encounter for other antenatal screening follow-up: Secondary | ICD-10-CM | POA: Diagnosis not present

## 2021-05-12 DIAGNOSIS — O99212 Obesity complicating pregnancy, second trimester: Secondary | ICD-10-CM | POA: Diagnosis present

## 2021-05-12 DIAGNOSIS — Z6841 Body Mass Index (BMI) 40.0 and over, adult: Secondary | ICD-10-CM

## 2021-05-26 ENCOUNTER — Other Ambulatory Visit: Payer: Self-pay

## 2021-05-26 ENCOUNTER — Ambulatory Visit (INDEPENDENT_AMBULATORY_CARE_PROVIDER_SITE_OTHER): Payer: Medicaid Other | Admitting: Family Medicine

## 2021-05-26 ENCOUNTER — Encounter: Payer: Self-pay | Admitting: Family Medicine

## 2021-05-26 VITALS — BP 109/71 | HR 85 | Wt 199.0 lb

## 2021-05-26 DIAGNOSIS — R87810 Cervical high risk human papillomavirus (HPV) DNA test positive: Secondary | ICD-10-CM | POA: Insufficient documentation

## 2021-05-26 DIAGNOSIS — Z3490 Encounter for supervision of normal pregnancy, unspecified, unspecified trimester: Secondary | ICD-10-CM

## 2021-05-26 DIAGNOSIS — Z3480 Encounter for supervision of other normal pregnancy, unspecified trimester: Secondary | ICD-10-CM

## 2021-05-26 DIAGNOSIS — O09529 Supervision of elderly multigravida, unspecified trimester: Secondary | ICD-10-CM

## 2021-05-26 DIAGNOSIS — O09899 Supervision of other high risk pregnancies, unspecified trimester: Secondary | ICD-10-CM

## 2021-05-26 DIAGNOSIS — R87610 Atypical squamous cells of undetermined significance on cytologic smear of cervix (ASC-US): Secondary | ICD-10-CM

## 2021-05-26 DIAGNOSIS — Z2839 Other underimmunization status: Secondary | ICD-10-CM

## 2021-05-26 DIAGNOSIS — O99210 Obesity complicating pregnancy, unspecified trimester: Secondary | ICD-10-CM

## 2021-05-26 NOTE — Progress Notes (Signed)
   Subjective:  Taylor Cochran is a 35 y.o. G3P2002 at [redacted]w[redacted]d being seen today for ongoing prenatal care.  She is currently monitored for the following issues for this low-risk pregnancy and has Breast lump on left side at 10 o'clock position; Supervision of other normal pregnancy, antepartum; Antepartum multigravida of advanced maternal age; Obesity affecting pregnancy, antepartum; Rubella non-immune status, antepartum; and ASCUS with positive high risk HPV cervical on their problem list.  Patient reports  allergy symptoms .  Contractions: Not present. Vag. Bleeding: None.  Movement: Present. Denies leaking of fluid.   The following portions of the patient's history were reviewed and updated as appropriate: allergies, current medications, past family history, past medical history, past social history, past surgical history and problem list. Problem list updated.  Objective:   Vitals:   05/26/21 1037  BP: 109/71  Pulse: 85  Weight: 199 lb (90.3 kg)    Fetal Status: Fetal Heart Rate (bpm): 152   Movement: Present     General:  Alert, oriented and cooperative. Patient is in no acute distress.  Skin: Skin is warm and dry. No rash noted.   Cardiovascular: Normal heart rate noted  Respiratory: Normal respiratory effort, no problems with respiration noted  Abdomen: Soft, gravid, appropriate for gestational age. Pain/Pressure: Absent     Pelvic: Vag. Bleeding: None     Cervical exam deferred        Extremities: Normal range of motion.  Edema: None  Mental Status: Normal mood and affect. Normal behavior. Normal judgment and thought content.   Urinalysis:      Assessment and Plan:  Pregnancy: G3P2002 at [redacted]w[redacted]d  1. Supervision of other normal pregnancy, antepartum BP and FHR normal Discussed contraception, still undecided Reminded of third trimester labs at next visit  2. Antepartum multigravida of advanced maternal age   57. Rubella non-immune status, antepartum MMR PP  4.  Obesity affecting pregnancy, antepartum   5. 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 Please refer to After Visit Summary for other counseling recommendations.  Return in 3 weeks (on 06/16/2021) for ob visit.   Clarnce Flock, MD

## 2021-05-26 NOTE — Patient Instructions (Signed)

## 2021-06-16 ENCOUNTER — Encounter: Payer: Self-pay | Admitting: Obstetrics and Gynecology

## 2021-06-16 ENCOUNTER — Other Ambulatory Visit: Payer: Self-pay

## 2021-06-16 ENCOUNTER — Ambulatory Visit (INDEPENDENT_AMBULATORY_CARE_PROVIDER_SITE_OTHER): Payer: Medicaid Other | Admitting: Obstetrics and Gynecology

## 2021-06-16 ENCOUNTER — Other Ambulatory Visit: Payer: Medicaid Other

## 2021-06-16 VITALS — BP 103/70 | HR 87 | Wt 201.0 lb

## 2021-06-16 DIAGNOSIS — Z3480 Encounter for supervision of other normal pregnancy, unspecified trimester: Secondary | ICD-10-CM

## 2021-06-16 DIAGNOSIS — Z2839 Other underimmunization status: Secondary | ICD-10-CM

## 2021-06-16 DIAGNOSIS — O99210 Obesity complicating pregnancy, unspecified trimester: Secondary | ICD-10-CM

## 2021-06-16 NOTE — Progress Notes (Signed)
   PRENATAL VISIT NOTE  Subjective:  Taylor Cochran is a 35 y.o. G3P2002 at [redacted]w[redacted]d being seen today for ongoing prenatal care.  She is currently monitored for the following issues for this high-risk pregnancy and has Breast lump on left side at 10 o'clock position; Supervision of other normal pregnancy, antepartum; Antepartum multigravida of advanced maternal age; Obesity affecting pregnancy, antepartum; Rubella non-immune status, antepartum; and ASCUS with positive high risk HPV cervical on their problem list.  Patient reports no complaints.  Contractions: Not present. Vag. Bleeding: None.  Movement: Present. Denies leaking of fluid.   The following portions of the patient's history were reviewed and updated as appropriate: allergies, current medications, past family history, past medical history, past social history, past surgical history and problem list.   Objective:   Vitals:   06/16/21 0824  BP: 103/70  Pulse: 87  Weight: 201 lb (91.2 kg)    Fetal Status: Fetal Heart Rate (bpm): 130 Fundal Height: 28 cm Movement: Present     General:  Alert, oriented and cooperative. Patient is in no acute distress.  Skin: Skin is warm and dry. No rash noted.   Cardiovascular: Normal heart rate noted  Respiratory: Normal respiratory effort, no problems with respiration noted  Abdomen: Soft, gravid, appropriate for gestational age.  Pain/Pressure: Present     Pelvic: Cervical exam deferred        Extremities: Normal range of motion.     Mental Status: Normal mood and affect. Normal behavior. Normal judgment and thought content.   Assessment and Plan:  Pregnancy: G3P2002 at [redacted]w[redacted]d 1. Supervision of other normal pregnancy, antepartum Patient is doing well without complaints Third trimester labs and glucola today Patient undecided on contraception (depo vs pills) - Glucose Tolerance, 2 Hours w/1 Hour - CBC - RPR - HIV Antibody (routine testing w rflx)  2. Obesity affecting pregnancy,  antepartum Continue ASA Follow up growth ultrasound scheduled  3. Rubella non-immune status, antepartum Will offer pp  Preterm labor symptoms and general obstetric precautions including but not limited to vaginal bleeding, contractions, leaking of fluid and fetal movement were reviewed in detail with the patient. Please refer to After Visit Summary for other counseling recommendations.   Return in about 2 weeks (around 06/30/2021) for in person, ROB, High risk.  Future Appointments  Date Time Provider Department Center  06/16/2021 10:45 AM Lillien Petronio, Gigi Gin, MD [facility] None  07/14/2021 10:00 AM [facility] NURSE [facility]  07/14/2021 10:15 AM [facility] US2 [facility]    Catalina Antigua, MD

## 2021-06-16 NOTE — Progress Notes (Signed)
Pt stats she is having some bleeding from gums and some pain.

## 2021-06-17 LAB — GLUCOSE TOLERANCE, 2 HOURS W/ 1HR
Glucose, 1 hour: 161 mg/dL (ref 65–179)
Glucose, 2 hour: 128 mg/dL (ref 65–152)
Glucose, Fasting: 81 mg/dL (ref 65–91)

## 2021-06-17 LAB — CBC
Hematocrit: 36.2 % (ref 34.0–46.6)
Hemoglobin: 12 g/dL (ref 11.1–15.9)
MCH: 31.1 pg (ref 26.6–33.0)
MCHC: 33.1 g/dL (ref 31.5–35.7)
MCV: 94 fL (ref 79–97)
Platelets: 188 10*3/uL (ref 150–450)
RBC: 3.86 x10E6/uL (ref 3.77–5.28)
RDW: 13.1 % (ref 11.7–15.4)
WBC: 5.8 10*3/uL (ref 3.4–10.8)

## 2021-06-17 LAB — RPR: RPR Ser Ql: NONREACTIVE

## 2021-06-17 LAB — HIV ANTIBODY (ROUTINE TESTING W REFLEX): HIV Screen 4th Generation wRfx: NONREACTIVE

## 2021-06-29 ENCOUNTER — Encounter: Payer: Self-pay | Admitting: Obstetrics and Gynecology

## 2021-06-29 ENCOUNTER — Other Ambulatory Visit: Payer: Self-pay

## 2021-06-29 ENCOUNTER — Ambulatory Visit (INDEPENDENT_AMBULATORY_CARE_PROVIDER_SITE_OTHER): Payer: Medicaid Other | Admitting: Obstetrics and Gynecology

## 2021-06-29 VITALS — BP 121/74 | HR 93 | Wt 203.0 lb

## 2021-06-29 DIAGNOSIS — O09899 Supervision of other high risk pregnancies, unspecified trimester: Secondary | ICD-10-CM

## 2021-06-29 DIAGNOSIS — Z2839 Other underimmunization status: Secondary | ICD-10-CM

## 2021-06-29 DIAGNOSIS — Z3480 Encounter for supervision of other normal pregnancy, unspecified trimester: Secondary | ICD-10-CM

## 2021-06-29 DIAGNOSIS — O99210 Obesity complicating pregnancy, unspecified trimester: Secondary | ICD-10-CM

## 2021-06-29 NOTE — Progress Notes (Signed)
+   Fetal movement. Pt states she is having increased round ligament pain.

## 2021-06-29 NOTE — Patient Instructions (Signed)

## 2021-06-29 NOTE — Progress Notes (Signed)
Subjective:  Taylor Cochran is a 35 y.o. G3P2002 at [redacted]w[redacted]d being seen today for ongoing prenatal care.  She is currently monitored for the following issues for this low-risk pregnancy and has Breast lump on left side at 10 o'clock position; Supervision of other normal pregnancy, antepartum; Antepartum multigravida of advanced maternal age; Obesity affecting pregnancy, antepartum; Rubella non-immune status, antepartum; and ASCUS with positive high risk HPV cervical on their problem list.  Patient reports general discomforts of pregnancy.  Contractions: Not present. Vag. Bleeding: None.  Movement: Present. Denies leaking of fluid.   The following portions of the patient's history were reviewed and updated as appropriate: allergies, current medications, past family history, past medical history, past social history, past surgical history and problem list. Problem list updated.  Objective:   Vitals:   06/29/21 1619  BP: 121/74  Pulse: 93  Weight: 203 lb (92.1 kg)    Fetal Status: Fetal Heart Rate (bpm): 136   Movement: Present     General:  Alert, oriented and cooperative. Patient is in no acute distress.  Skin: Skin is warm and dry. No rash noted.   Cardiovascular: Normal heart rate noted  Respiratory: Normal respiratory effort, no problems with respiration noted  Abdomen: Soft, gravid, appropriate for gestational age. Pain/Pressure: Present     Pelvic:  Cervical exam deferred        Extremities: Normal range of motion.  Edema: None  Mental Status: Normal mood and affect. Normal behavior. Normal judgment and thought content.   Urinalysis:      Assessment and Plan:  Pregnancy: G3P2002 at [redacted]w[redacted]d  1. Supervision of other normal pregnancy, antepartum Stable  2. Rubella non-immune status, antepartum Vaccine PP  3. Obesity affecting pregnancy, antepartum Growth scan 07/14/21  Preterm labor symptoms and general obstetric precautions including but not limited to vaginal bleeding,  contractions, leaking of fluid and fetal movement were reviewed in detail with the patient. Please refer to After Visit Summary for other counseling recommendations.  Return in about 2 weeks (around 07/13/2021) for OB visit, face to face, any provider.   Hermina Staggers, MD

## 2021-07-06 ENCOUNTER — Telehealth: Payer: Self-pay

## 2021-07-06 NOTE — Telephone Encounter (Signed)
Patient complains of having really bad pelvic pain that started yesterday. Patient states that she did not go to work today because of the pain. Patient advised to wear a maternity support belt to help with pain. Also advised to rest as much as possible. If pain worsens to call the office to schedule an appointment sooner. Advised if she has a decrease in fetal movement, contractions, LOF to go to [facility] for evaluation.

## 2021-07-13 ENCOUNTER — Ambulatory Visit (INDEPENDENT_AMBULATORY_CARE_PROVIDER_SITE_OTHER): Payer: Medicaid Other | Admitting: Obstetrics

## 2021-07-13 ENCOUNTER — Other Ambulatory Visit: Payer: Self-pay

## 2021-07-13 ENCOUNTER — Encounter: Payer: Self-pay | Admitting: Obstetrics

## 2021-07-13 VITALS — BP 115/70 | HR 94 | Wt 208.0 lb

## 2021-07-13 DIAGNOSIS — R87610 Atypical squamous cells of undetermined significance on cytologic smear of cervix (ASC-US): Secondary | ICD-10-CM

## 2021-07-13 DIAGNOSIS — O09899 Supervision of other high risk pregnancies, unspecified trimester: Secondary | ICD-10-CM

## 2021-07-13 DIAGNOSIS — O99210 Obesity complicating pregnancy, unspecified trimester: Secondary | ICD-10-CM

## 2021-07-13 DIAGNOSIS — O09529 Supervision of elderly multigravida, unspecified trimester: Secondary | ICD-10-CM

## 2021-07-13 DIAGNOSIS — Z23 Encounter for immunization: Secondary | ICD-10-CM

## 2021-07-13 DIAGNOSIS — Z3480 Encounter for supervision of other normal pregnancy, unspecified trimester: Secondary | ICD-10-CM

## 2021-07-13 DIAGNOSIS — O99213 Obesity complicating pregnancy, third trimester: Secondary | ICD-10-CM

## 2021-07-13 DIAGNOSIS — Z3A32 32 weeks gestation of pregnancy: Secondary | ICD-10-CM

## 2021-07-13 DIAGNOSIS — O09523 Supervision of elderly multigravida, third trimester: Secondary | ICD-10-CM

## 2021-07-13 DIAGNOSIS — R87810 Cervical high risk human papillomavirus (HPV) DNA test positive: Secondary | ICD-10-CM

## 2021-07-13 DIAGNOSIS — Z2839 Other underimmunization status: Secondary | ICD-10-CM

## 2021-07-13 NOTE — Progress Notes (Signed)
Pt presents for ROB without complaints today. TDAP given LD without difficulty

## 2021-07-13 NOTE — Progress Notes (Signed)
Subjective:  Taylor Cochran is a 35 y.o. G3P2002 at [redacted]w[redacted]d being seen today for ongoing prenatal care.  She is currently monitored for the following issues for this low-risk pregnancy and has Breast lump on left side at 10 o'clock position; Supervision of other normal pregnancy, antepartum; Antepartum multigravida of advanced maternal age; Obesity affecting pregnancy, antepartum; Rubella non-immune status, antepartum; and ASCUS with positive high risk HPV cervical on their problem list.  Patient reports no complaints.  Contractions: Not present. Vag. Bleeding: None.  Movement: Present. Denies leaking of fluid.   The following portions of the patient's history were reviewed and updated as appropriate: allergies, current medications, past family history, past medical history, past social history, past surgical history and problem list. Problem list updated.  Objective:   Vitals:   07/13/21 1539  BP: 115/70  Pulse: 94  Weight: 208 lb (94.3 kg)    Fetal Status:     Movement: Present     General:  Alert, oriented and cooperative. Patient is in no acute distress.  Skin: Skin is warm and dry. No rash noted.   Cardiovascular: Normal heart rate noted  Respiratory: Normal respiratory effort, no problems with respiration noted  Abdomen: Soft, gravid, appropriate for gestational age. Pain/Pressure: Present     Pelvic:  Cervical exam deferred        Extremities: Normal range of motion.  Edema: None  Mental Status: Normal mood and affect. Normal behavior. Normal judgment and thought content.   Urinalysis:      Assessment and Plan:  Pregnancy: G3P2002 at [redacted]w[redacted]d  1. Supervision of other normal pregnancy, antepartum  2. Antepartum multigravida of advanced maternal age  32. Rubella non-immune status, antepartum - rubella vaccination postpartum  4. Obesity affecting pregnancy, antepartum  5. ASCUS with positive high risk HPV cervical - colposcopy 3-4 months postpartum    There are no  diagnoses linked to this encounter. Preterm labor symptoms and general obstetric precautions including but not limited to vaginal bleeding, contractions, leaking of fluid and fetal movement were reviewed in detail with the patient. Please refer to After Visit Summary for other counseling recommendations.   Return in about 2 weeks (around 07/27/2021) for ROB.   Brock Bad, MD  07/13/21

## 2021-07-14 ENCOUNTER — Ambulatory Visit: Payer: Medicaid Other | Attending: Obstetrics and Gynecology

## 2021-07-14 ENCOUNTER — Ambulatory Visit: Payer: Medicaid Other | Admitting: *Deleted

## 2021-07-14 VITALS — BP 130/66 | HR 94

## 2021-07-14 DIAGNOSIS — O09523 Supervision of elderly multigravida, third trimester: Secondary | ICD-10-CM | POA: Diagnosis present

## 2021-07-14 DIAGNOSIS — Z3480 Encounter for supervision of other normal pregnancy, unspecified trimester: Secondary | ICD-10-CM

## 2021-07-14 DIAGNOSIS — Z3A32 32 weeks gestation of pregnancy: Secondary | ICD-10-CM | POA: Diagnosis not present

## 2021-07-14 DIAGNOSIS — Z362 Encounter for other antenatal screening follow-up: Secondary | ICD-10-CM | POA: Insufficient documentation

## 2021-07-14 DIAGNOSIS — O99213 Obesity complicating pregnancy, third trimester: Secondary | ICD-10-CM | POA: Diagnosis not present

## 2021-07-27 ENCOUNTER — Other Ambulatory Visit: Payer: Self-pay

## 2021-07-27 ENCOUNTER — Encounter: Payer: Self-pay | Admitting: Obstetrics and Gynecology

## 2021-07-27 ENCOUNTER — Ambulatory Visit (INDEPENDENT_AMBULATORY_CARE_PROVIDER_SITE_OTHER): Payer: Medicaid Other | Admitting: Obstetrics and Gynecology

## 2021-07-27 VITALS — BP 111/65 | HR 77 | Wt 206.2 lb

## 2021-07-27 DIAGNOSIS — Z2839 Other underimmunization status: Secondary | ICD-10-CM

## 2021-07-27 DIAGNOSIS — Z3480 Encounter for supervision of other normal pregnancy, unspecified trimester: Secondary | ICD-10-CM

## 2021-07-27 DIAGNOSIS — O09899 Supervision of other high risk pregnancies, unspecified trimester: Secondary | ICD-10-CM

## 2021-07-27 DIAGNOSIS — O99210 Obesity complicating pregnancy, unspecified trimester: Secondary | ICD-10-CM

## 2021-07-27 DIAGNOSIS — O09529 Supervision of elderly multigravida, unspecified trimester: Secondary | ICD-10-CM

## 2021-07-27 DIAGNOSIS — R87810 Cervical high risk human papillomavirus (HPV) DNA test positive: Secondary | ICD-10-CM

## 2021-07-27 DIAGNOSIS — R87610 Atypical squamous cells of undetermined significance on cytologic smear of cervix (ASC-US): Secondary | ICD-10-CM

## 2021-07-27 DIAGNOSIS — Z3A33 33 weeks gestation of pregnancy: Secondary | ICD-10-CM

## 2021-07-27 NOTE — Progress Notes (Signed)
Pt reports fetal movement, denies pain.  

## 2021-07-27 NOTE — Progress Notes (Signed)
   PRENATAL VISIT NOTE  Subjective:  Taylor Cochran is a 35 y.o. G3P2002 at [redacted]w[redacted]d being seen today for ongoing prenatal care.  She is currently monitored for the following issues for this low-risk pregnancy and has Breast lump on left side at 10 o'clock position; Supervision of other normal pregnancy, antepartum; Antepartum multigravida of advanced maternal age; Obesity affecting pregnancy, antepartum; Rubella non-immune status, antepartum; and ASCUS with positive high risk HPV cervical on their problem list.  Patient reports fatigue.  Contractions: Not present. Vag. Bleeding: None.  Movement: Present. Denies leaking of fluid.   The following portions of the patient's history were reviewed and updated as appropriate: allergies, current medications, past family history, past medical history, past social history, past surgical history and problem list.   Objective:   Vitals:   07/27/21 1122  BP: 111/65  Pulse: 77  Weight: 206 lb 3.2 oz (93.5 kg)   Fetal Status: Fetal Heart Rate (bpm): 146   Movement: Present     General:  Alert, oriented and cooperative. Patient is in no acute distress.  Skin: Skin is warm and dry. No rash noted.   Cardiovascular: Normal heart rate noted  Respiratory: Normal respiratory effort, no problems with respiration noted  Abdomen: Soft, gravid, appropriate for gestational age.  Pain/Pressure: Absent     Pelvic: Cervical exam deferred        Extremities: Normal range of motion.  Edema: Trace  Mental Status: Normal mood and affect. Normal behavior. Normal judgment and thought content.   Assessment and Plan:  Pregnancy: G3P2002 at [redacted]w[redacted]d  1. Supervision of other normal pregnancy, antepartum  2. Obesity affecting pregnancy, antepartum  3. Antepartum multigravida of advanced maternal age  51. ASCUS with positive high risk HPV cervical Colpo pp  5. Rubella non-immune status, antepartum MMR pp  6. [redacted] weeks gestation of pregnancy   Preterm labor  symptoms and general obstetric precautions including but not limited to vaginal bleeding, contractions, leaking of fluid and fetal movement were reviewed in detail with the patient. Please refer to After Visit Summary for other counseling recommendations.   Return in about 2 weeks (around 08/10/2021) for low OB, in person, 36 week swabs.  Future Appointments  Date Time Provider [place]  08/11/2021  9:35 AM Constant, Vickii Chafe, MD [facility] None    Sloan Leiter, MD

## 2021-07-27 NOTE — Patient Instructions (Signed)
Go to the [facility] [place] Address: [address], [place], [place] [address] Phone: [phone]  

## 2021-08-11 ENCOUNTER — Encounter: Payer: Self-pay | Admitting: Obstetrics and Gynecology

## 2021-08-11 ENCOUNTER — Other Ambulatory Visit (HOSPITAL_COMMUNITY)
Admission: RE | Admit: 2021-08-11 | Discharge: 2021-08-11 | Disposition: A | Discharge status: Home / Self Care | Payer: Medicaid Other | Source: Ambulatory Visit | Attending: Obstetrics and Gynecology | Admitting: Obstetrics and Gynecology

## 2021-08-11 ENCOUNTER — Ambulatory Visit (INDEPENDENT_AMBULATORY_CARE_PROVIDER_SITE_OTHER): Payer: Medicaid Other | Admitting: Obstetrics and Gynecology

## 2021-08-11 ENCOUNTER — Other Ambulatory Visit: Payer: Self-pay

## 2021-08-11 VITALS — BP 110/73 | HR 79 | Wt 207.0 lb

## 2021-08-11 DIAGNOSIS — Z3480 Encounter for supervision of other normal pregnancy, unspecified trimester: Secondary | ICD-10-CM

## 2021-08-11 DIAGNOSIS — O09529 Supervision of elderly multigravida, unspecified trimester: Secondary | ICD-10-CM

## 2021-08-11 DIAGNOSIS — O99210 Obesity complicating pregnancy, unspecified trimester: Secondary | ICD-10-CM

## 2021-08-11 NOTE — Progress Notes (Signed)
+   Fetal movement. No complaints. Cultures today.  

## 2021-08-11 NOTE — Progress Notes (Signed)
   PRENATAL VISIT NOTE  Subjective:  Taylor Cochran is a 35 y.o. G3P2002 at [redacted]w[redacted]d being seen today for ongoing prenatal care.  She is currently monitored for the following issues for this high-risk pregnancy and has Breast lump on left side at 10 o'clock position; Supervision of other normal pregnancy, antepartum; Antepartum multigravida of advanced maternal age; Obesity affecting pregnancy, antepartum; Rubella non-immune status, antepartum; and ASCUS with positive high risk HPV cervical on their problem list.  Patient reports no complaints.  Contractions: Not present. Vag. Bleeding: None.  Movement: Present. Denies leaking of fluid.   The following portions of the patient's history were reviewed and updated as appropriate: allergies, current medications, past family history, past medical history, past social history, past surgical history and problem list.   Objective:   Vitals:   08/11/21 0948  BP: 110/73  Pulse: 79  Weight: 207 lb (93.9 kg)    Fetal Status: Fetal Heart Rate (bpm): 138 Fundal Height: 36 cm Movement: Present  Presentation: Vertex  General:  Alert, oriented and cooperative. Patient is in no acute distress.  Skin: Skin is warm and dry. No rash noted.   Cardiovascular: Normal heart rate noted  Respiratory: Normal respiratory effort, no problems with respiration noted  Abdomen: Soft, gravid, appropriate for gestational age.  Pain/Pressure: Present     Pelvic: Cervical exam performed in the presence of a chaperone Dilation: 1 Effacement (%): Thick Station: -3  Extremities: Normal range of motion.  Edema: Trace  Mental Status: Normal mood and affect. Normal behavior. Normal judgment and thought content.   Assessment and Plan:  Pregnancy: G3P2002 at [redacted]w[redacted]d 1. Supervision of other normal pregnancy, antepartum Patient is doing well without complaints Plans pills for contraception and plans to use same pediatrician Cultures today  2. Obesity affecting pregnancy,  antepartum Continue ASA Follow up ultrasound scheduled  3. Antepartum multigravida of advanced maternal age   Preterm labor symptoms and general obstetric precautions including but not limited to vaginal bleeding, contractions, leaking of fluid and fetal movement were reviewed in detail with the patient. Please refer to After Visit Summary for other counseling recommendations.   Return in about 1 week (around 08/18/2021) for in person, ROB, High risk.  No future appointments.  Catalina Antigua, MD

## 2021-08-14 LAB — CERVICOVAGINAL ANCILLARY ONLY
Chlamydia: NEGATIVE
Comment: NEGATIVE
Comment: NORMAL
Neisseria Gonorrhea: NEGATIVE

## 2021-08-15 LAB — CULTURE, BETA STREP (GROUP B ONLY): Strep Gp B Culture: NEGATIVE

## 2021-08-16 ENCOUNTER — Other Ambulatory Visit: Payer: Self-pay

## 2021-08-16 ENCOUNTER — Encounter: Payer: Self-pay | Admitting: Obstetrics and Gynecology

## 2021-08-16 ENCOUNTER — Ambulatory Visit (INDEPENDENT_AMBULATORY_CARE_PROVIDER_SITE_OTHER): Payer: Medicaid Other | Admitting: Obstetrics and Gynecology

## 2021-08-16 VITALS — BP 119/82 | HR 90 | Wt 204.0 lb

## 2021-08-16 DIAGNOSIS — Z3480 Encounter for supervision of other normal pregnancy, unspecified trimester: Secondary | ICD-10-CM

## 2021-08-16 DIAGNOSIS — N6322 Unspecified lump in the left breast, upper inner quadrant: Secondary | ICD-10-CM

## 2021-08-16 DIAGNOSIS — O09899 Supervision of other high risk pregnancies, unspecified trimester: Secondary | ICD-10-CM

## 2021-08-16 DIAGNOSIS — Z3A36 36 weeks gestation of pregnancy: Secondary | ICD-10-CM

## 2021-08-16 DIAGNOSIS — O09529 Supervision of elderly multigravida, unspecified trimester: Secondary | ICD-10-CM

## 2021-08-16 DIAGNOSIS — O99210 Obesity complicating pregnancy, unspecified trimester: Secondary | ICD-10-CM

## 2021-08-16 DIAGNOSIS — Z2839 Other underimmunization status: Secondary | ICD-10-CM

## 2021-08-16 NOTE — Progress Notes (Signed)
ROB [redacted]w[redacted]d  GC/CT:08/11/21 Neg GBS:08/11/21 Neg  CC: None

## 2021-08-16 NOTE — Progress Notes (Signed)
   PRENATAL VISIT NOTE  Subjective:  Taylor Cochran is a 35 y.o. G3P2002 at [redacted]w[redacted]d being seen today for ongoing prenatal care.  She is currently monitored for the following issues for this low-risk pregnancy and has Breast lump on left side at 10 o'clock position; Supervision of other normal pregnancy, antepartum; Antepartum multigravida of advanced maternal age; Obesity affecting pregnancy, antepartum; Rubella non-immune status, antepartum; ASCUS with positive high risk HPV cervical; and [redacted] weeks gestation of pregnancy on their problem list.  Patient doing well with no acute concerns today. She reports no complaints.  Contractions: Not present. Vag. Bleeding: None.  Movement: Present. Denies leaking of fluid.   The following portions of the patient's history were reviewed and updated as appropriate: allergies, current medications, past family history, past medical history, past social history, past surgical history and problem list. Problem list updated.  Objective:   Vitals:   08/16/21 1130  BP: 119/82  Pulse: 90  Weight: 204 lb (92.5 kg)    Fetal Status: Fetal Heart Rate (bpm): 145 Fundal Height: 37 cm Movement: Present     General:  Alert, oriented and cooperative. Patient is in no acute distress.  Skin: Skin is warm and dry. No rash noted.   Cardiovascular: Normal heart rate noted  Respiratory: Normal respiratory effort, no problems with respiration noted  Abdomen: Soft, gravid, appropriate for gestational age.  Pain/Pressure: Absent     Pelvic: Cervical exam deferred        Extremities: Normal range of motion.  Edema: Trace  Mental Status:  Normal mood and affect. Normal behavior. Normal judgment and thought content.   Assessment and Plan:  Pregnancy: G3P2002 at [redacted]w[redacted]d  1. Supervision of other normal pregnancy, antepartum Continue routine care  2. [redacted] weeks gestation of pregnancy   3. Breast lump on left side at 10 o'clock position   4. Antepartum multigravida of  advanced maternal age Pt continues with baby ASA  5. Obesity affecting pregnancy, antepartum   6. Rubella non-immune status, antepartum Vaccinate after delivery  Preterm labor symptoms and general obstetric precautions including but not limited to vaginal bleeding, contractions, leaking of fluid and fetal movement were reviewed in detail with the patient.  Please refer to After Visit Summary for other counseling recommendations.   Return in about 1 week (around 08/23/2021) for ROB, in person.   Mariel Aloe, MD Faculty Attending Center for [facility]

## 2021-08-22 ENCOUNTER — Other Ambulatory Visit: Payer: Self-pay

## 2021-08-22 ENCOUNTER — Ambulatory Visit (INDEPENDENT_AMBULATORY_CARE_PROVIDER_SITE_OTHER): Payer: Medicaid Other | Admitting: Obstetrics & Gynecology

## 2021-08-22 DIAGNOSIS — Z3480 Encounter for supervision of other normal pregnancy, unspecified trimester: Secondary | ICD-10-CM

## 2021-08-22 NOTE — Progress Notes (Signed)
Pt reports fetal movement with some pressure. 

## 2021-08-22 NOTE — Progress Notes (Signed)
   PRENATAL VISIT NOTE  Subjective:  Taylor Cochran is a 35 y.o. G3P2002 at [redacted]w[redacted]d being seen today for ongoing prenatal care.  She is currently monitored for the following issues for this high-risk pregnancy and has Breast lump on left side at 10 o'clock position; Supervision of other normal pregnancy, antepartum; Antepartum multigravida of advanced maternal age; Obesity affecting pregnancy, antepartum; Rubella non-immune status, antepartum; ASCUS with positive high risk HPV cervical; and [redacted] weeks gestation of pregnancy on their problem list.  Patient reports occasional contractions.  Contractions: Not present. Vag. Bleeding: None.  Movement: Present. Denies leaking of fluid.   The following portions of the patient's history were reviewed and updated as appropriate: allergies, current medications, past family history, past medical history, past social history, past surgical history and problem list.   Objective:   Vitals:   08/22/21 1335  BP: 128/81  Pulse: 92  Weight: 208 lb 12.8 oz (94.7 kg)    Fetal Status: Fetal Heart Rate (bpm): 146 Fundal Height: 37 cm Movement: Present     General:  Alert, oriented and cooperative. Patient is in no acute distress.  Skin: Skin is warm and dry. No rash noted.   Cardiovascular: Normal heart rate noted  Respiratory: Normal respiratory effort, no problems with respiration noted  Abdomen: Soft, gravid, appropriate for gestational age.  Pain/Pressure: Present     Pelvic: Cervical exam deferred        Extremities: Normal range of motion.  Edema: Trace  Mental Status: Normal mood and affect. Normal behavior. Normal judgment and thought content.   Assessment and Plan:  Pregnancy: G3P2002 at [redacted]w[redacted]d 1. Supervision of other normal pregnancy, antepartum Doing well, GBS negative  Term labor symptoms and general obstetric precautions including but not limited to vaginal bleeding, contractions, leaking of fluid and fetal movement were reviewed in detail  with the patient. Please refer to After Visit Summary for other counseling recommendations.   Return in about 1 week (around 08/29/2021).  No future appointments.  Scheryl Darter, MD

## 2021-09-01 ENCOUNTER — Other Ambulatory Visit: Payer: Self-pay

## 2021-09-01 ENCOUNTER — Telehealth (HOSPITAL_COMMUNITY): Payer: Self-pay | Admitting: *Deleted

## 2021-09-01 ENCOUNTER — Ambulatory Visit (INDEPENDENT_AMBULATORY_CARE_PROVIDER_SITE_OTHER): Payer: Medicaid Other | Admitting: Obstetrics and Gynecology

## 2021-09-01 ENCOUNTER — Encounter (HOSPITAL_COMMUNITY): Payer: Self-pay | Admitting: *Deleted

## 2021-09-01 ENCOUNTER — Encounter (HOSPITAL_COMMUNITY): Payer: Self-pay

## 2021-09-01 VITALS — BP 111/71 | HR 92 | Wt 213.0 lb

## 2021-09-01 DIAGNOSIS — Z3480 Encounter for supervision of other normal pregnancy, unspecified trimester: Secondary | ICD-10-CM

## 2021-09-01 DIAGNOSIS — O09899 Supervision of other high risk pregnancies, unspecified trimester: Secondary | ICD-10-CM

## 2021-09-01 DIAGNOSIS — Z2839 Other underimmunization status: Secondary | ICD-10-CM

## 2021-09-01 DIAGNOSIS — O09529 Supervision of elderly multigravida, unspecified trimester: Secondary | ICD-10-CM

## 2021-09-01 DIAGNOSIS — O480 Post-term pregnancy: Secondary | ICD-10-CM

## 2021-09-01 DIAGNOSIS — Z3A39 39 weeks gestation of pregnancy: Secondary | ICD-10-CM | POA: Insufficient documentation

## 2021-09-01 NOTE — Telephone Encounter (Signed)
Preadmission screen  

## 2021-09-01 NOTE — Progress Notes (Signed)
+   Fetal movement. No complaints.  

## 2021-09-01 NOTE — Progress Notes (Signed)
   PRENATAL VISIT NOTE  Subjective:  Taylor Cochran is a 35 y.o. G3P2002 at [redacted]w[redacted]d being seen today for ongoing prenatal care.  She is currently monitored for the following issues for this low-risk pregnancy and has Breast lump on left side at 10 o'clock position; Supervision of other normal pregnancy, antepartum; Antepartum multigravida of advanced maternal age; Obesity affecting pregnancy, antepartum; Rubella non-immune status, antepartum; ASCUS with positive high risk HPV cervical; [redacted] weeks gestation of pregnancy; and [redacted] weeks gestation of pregnancy on their problem list.  Patient doing well with no acute concerns today. She reports no complaints.  Contractions: Irritability. Vag. Bleeding: None.  Movement: Present. Denies leaking of fluid.   The following portions of the patient's history were reviewed and updated as appropriate: allergies, current medications, past family history, past medical history, past social history, past surgical history and problem list. Problem list updated.  Objective:   Vitals:   09/01/21 1105  BP: 111/71  Pulse: 92  Weight: 213 lb (96.6 kg)    Fetal Status: Fetal Heart Rate (bpm): 131 Fundal Height: 38 cm Movement: Present  Presentation: Vertex  General:  Alert, oriented and cooperative. Patient is in no acute distress.  Skin: Skin is warm and dry. No rash noted.   Cardiovascular: Normal heart rate noted  Respiratory: Normal respiratory effort, no problems with respiration noted  Abdomen: Soft, gravid, appropriate for gestational age.  Pain/Pressure: Present     Pelvic: Cervical exam deferred Dilation: 2 Effacement (%): 70 Station: -3  Extremities: Normal range of motion.  Edema: Trace  Mental Status:  Normal mood and affect. Normal behavior. Normal judgment and thought content.   Assessment and Plan:  Pregnancy: G3P2002 at [redacted]w[redacted]d  1. [redacted] weeks gestation of pregnancy   2. Supervision of other normal pregnancy, antepartum Continue routine care,  IOL scheduled for 41 weeks, nst after 40 weeks  3. Rubella non-immune status, antepartum Treat after delivery  4. Antepartum multigravida of advanced maternal age   15. Post term pregnancy over 40 weeks  - US FETAL BPP W/NONSTRESS; Future  Term labor symptoms and general obstetric precautions including but not limited to vaginal bleeding, contractions, leaking of fluid and fetal movement were reviewed in detail with the patient.  Please refer to After Visit Summary for other counseling recommendations.   Return in about 1 week (around 09/08/2021) for ROB, in person.   Mariel Aloe, MD Faculty Attending Center for [facility]

## 2021-09-05 ENCOUNTER — Other Ambulatory Visit: Payer: Self-pay | Admitting: Advanced Practice Midwife

## 2021-09-07 ENCOUNTER — Telehealth: Payer: Self-pay

## 2021-09-07 ENCOUNTER — Encounter: Payer: Self-pay | Admitting: Obstetrics & Gynecology

## 2021-09-07 ENCOUNTER — Ambulatory Visit (INDEPENDENT_AMBULATORY_CARE_PROVIDER_SITE_OTHER): Payer: Medicaid Other | Admitting: Obstetrics & Gynecology

## 2021-09-07 VITALS — BP 123/76 | HR 90 | Wt 211.0 lb

## 2021-09-07 DIAGNOSIS — O09523 Supervision of elderly multigravida, third trimester: Secondary | ICD-10-CM

## 2021-09-07 DIAGNOSIS — Z3480 Encounter for supervision of other normal pregnancy, unspecified trimester: Secondary | ICD-10-CM

## 2021-09-07 NOTE — Telephone Encounter (Signed)
Pt had visit today with Dr. Debroah Loop and states when she went to the bathroom she noticed some blood and mucousy discharge on the toilet paper. States she is having increased pressure but no contractions. Pt did have cervical exam at her visit today. Baby is moving well and pt denies any leaking fluid. Advised patient this is normal after a cervical exam and could last up to 24 hours. Pt was given labor precautions and advised to monitor. She is agreeable to plan and verbalized understanding.

## 2021-09-07 NOTE — Progress Notes (Signed)
   PRENATAL VISIT NOTE  Subjective:  Taylor Cochran is a 35 y.o. G3P2002 at [redacted]w[redacted]d being seen today for ongoing prenatal care.  She is currently monitored for the following issues for this high-risk pregnancy and has Breast lump on left side at 10 o'clock position; Supervision of other normal pregnancy, antepartum; Antepartum multigravida of advanced maternal age; Obesity affecting pregnancy, antepartum; Rubella non-immune status, antepartum; ASCUS with positive high risk HPV cervical; [redacted] weeks gestation of pregnancy; and [redacted] weeks gestation of pregnancy on their problem list.  Patient reports occasional contractions.  Contractions: Not present. Vag. Bleeding: None.  Movement: Present. Denies leaking of fluid.   The following portions of the patient's history were reviewed and updated as appropriate: allergies, current medications, past family history, past medical history, past social history, past surgical history and problem list.   Objective:   Vitals:   09/07/21 1041  BP: 132/87  Pulse: 84  Weight: 211 lb (95.7 kg)    Fetal Status: Fetal Heart Rate (bpm): 133   Movement: Present     General:  Alert, oriented and cooperative. Patient is in no acute distress.  Skin: Skin is warm and dry. No rash noted.   Cardiovascular: Normal heart rate noted  Respiratory: Normal respiratory effort, no problems with respiration noted  Abdomen: Soft, gravid, appropriate for gestational age.  Pain/Pressure: Present     Pelvic: Cervical exam performed in the presence of a chaperone        Extremities: Normal range of motion.  Edema: Trace  IOL 41 weeks scheduled, NST in 4 days   2. AMA (advanced maternal age) multigravida 35+, third trimester   Term labor symptoms and general obstetric precautions including but not limited to vaginal bleeding, contractions, leaking of fluid and fetal movement were reviewed in detail with the patient. Please refer to After Visit Summary for other counseling  recommendations.   Return if symptoms worsen or fail to improve, for postpartum.  Future Appointments  Date Time Provider Department Center  09/11/2021  8:15 AM [facility] NST [facility]  09/15/2021  6:45 AM [facility] SCHED ROOM [facility] None    Scheryl Darter, MD

## 2021-09-11 ENCOUNTER — Ambulatory Visit (INDEPENDENT_AMBULATORY_CARE_PROVIDER_SITE_OTHER): Payer: Medicaid Other

## 2021-09-11 ENCOUNTER — Inpatient Hospital Stay (HOSPITAL_COMMUNITY): Payer: Medicaid Other | Admitting: Anesthesiology

## 2021-09-11 ENCOUNTER — Other Ambulatory Visit: Payer: Self-pay

## 2021-09-11 ENCOUNTER — Inpatient Hospital Stay (HOSPITAL_COMMUNITY)
Admission: AD | Admit: 2021-09-11 | Discharge: 2021-09-13 | DRG: 807 | Disposition: A | Discharge status: Home / Self Care | Payer: Medicaid Other | Attending: Obstetrics & Gynecology | Admitting: Obstetrics & Gynecology

## 2021-09-11 ENCOUNTER — Ambulatory Visit: Payer: Medicaid Other | Admitting: *Deleted

## 2021-09-11 ENCOUNTER — Encounter (HOSPITAL_COMMUNITY): Payer: Self-pay | Admitting: Family Medicine

## 2021-09-11 DIAGNOSIS — O4292 Full-term premature rupture of membranes, unspecified as to length of time between rupture and onset of labor: Secondary | ICD-10-CM | POA: Diagnosis present

## 2021-09-11 DIAGNOSIS — R030 Elevated blood-pressure reading, without diagnosis of hypertension: Secondary | ICD-10-CM | POA: Diagnosis present

## 2021-09-11 DIAGNOSIS — O480 Post-term pregnancy: Secondary | ICD-10-CM | POA: Diagnosis not present

## 2021-09-11 DIAGNOSIS — Z3A40 40 weeks gestation of pregnancy: Secondary | ICD-10-CM

## 2021-09-11 DIAGNOSIS — O99214 Obesity complicating childbirth: Secondary | ICD-10-CM | POA: Diagnosis present

## 2021-09-11 DIAGNOSIS — Z20822 Contact with and (suspected) exposure to covid-19: Secondary | ICD-10-CM | POA: Diagnosis present

## 2021-09-11 DIAGNOSIS — O26893 Other specified pregnancy related conditions, third trimester: Secondary | ICD-10-CM | POA: Diagnosis present

## 2021-09-11 DIAGNOSIS — O4202 Full-term premature rupture of membranes, onset of labor within 24 hours of rupture: Secondary | ICD-10-CM | POA: Diagnosis not present

## 2021-09-11 DIAGNOSIS — Z3480 Encounter for supervision of other normal pregnancy, unspecified trimester: Secondary | ICD-10-CM

## 2021-09-11 LAB — COMPREHENSIVE METABOLIC PANEL
ALT: 19 U/L (ref 0–44)
AST: 18 U/L (ref 15–41)
Albumin: 2.4 g/dL — ABNORMAL LOW (ref 3.5–5.0)
Alkaline Phosphatase: 280 U/L — ABNORMAL HIGH (ref 38–126)
Anion gap: 9 (ref 5–15)
BUN: 5 mg/dL — ABNORMAL LOW (ref 6–20)
CO2: 20 mmol/L — ABNORMAL LOW (ref 22–32)
Calcium: 8.5 mg/dL — ABNORMAL LOW (ref 8.9–10.3)
Chloride: 107 mmol/L (ref 98–111)
Creatinine, Ser: 0.48 mg/dL (ref 0.44–1.00)
GFR, Estimated: >60 mL/min (ref >60)
Glucose, Bld: 209 mg/dL — ABNORMAL HIGH (ref 70–99)
Potassium: 3.3 mmol/L — ABNORMAL LOW (ref 3.5–5.1)
Sodium: 136 mmol/L (ref 135–145)
Total Bilirubin: 0.8 mg/dL (ref 0.3–1.2)
Total Protein: 5.3 g/dL — ABNORMAL LOW (ref 6.5–8.1)

## 2021-09-11 LAB — TYPE AND SCREEN
ABO/RH(D): O POS
Antibody Screen: NEGATIVE

## 2021-09-11 LAB — CBC
HCT: 37.6 % (ref 36.0–46.0)
Hemoglobin: 12.5 g/dL (ref 12.0–15.0)
MCH: 30.6 pg (ref 26.0–34.0)
MCHC: 33.2 g/dL (ref 30.0–36.0)
MCV: 92.2 fL (ref 80.0–100.0)
Platelets: 189 10*3/uL (ref 150–400)
RBC: 4.08 MIL/uL (ref 3.87–5.11)
RDW: 12.9 % (ref 11.5–15.5)
WBC: 5.7 10*3/uL (ref 4.0–10.5)
nRBC: 0 % (ref 0.0–0.2)

## 2021-09-11 LAB — RESP PANEL BY RT-PCR (FLU A&B, COVID) ARPGX2
Influenza A by PCR: NEGATIVE
Influenza B by PCR: NEGATIVE
SARS Coronavirus 2 by RT PCR: NEGATIVE

## 2021-09-11 MED ORDER — BENZOCAINE-MENTHOL 20-0.5 % EX AERO: 1.0000 "application " | INHALATION_SPRAY | CUTANEOUS | Status: DC | PRN

## 2021-09-11 MED ORDER — ZOLPIDEM TARTRATE 5 MG PO TABS: 5.0000 mg | ORAL_TABLET | Freq: Every evening | ORAL | Status: DC | PRN

## 2021-09-11 MED ORDER — OXYTOCIN BOLUS FROM INFUSION
333.0000 mL | Freq: Once | INTRAVENOUS | Status: AC
  Administered 2021-09-11: 333 mL via INTRAVENOUS

## 2021-09-11 MED ORDER — IBUPROFEN 600 MG PO TABS
600.0000 mg | ORAL_TABLET | Freq: Four times a day (QID) | ORAL | Status: DC
  Administered 2021-09-12 – 2021-09-13 (×7): 600 mg via ORAL
  Filled 2021-09-11 (×7): qty 1

## 2021-09-11 MED ORDER — DIPHENHYDRAMINE HCL 50 MG/ML IJ SOLN
12.5000 mg | INTRAMUSCULAR | Status: DC | PRN
Start: 2021-09-11 — End: 2021-09-11

## 2021-09-11 MED ORDER — ONDANSETRON HCL 4 MG PO TABS: 4.0000 mg | ORAL_TABLET | ORAL | Status: DC | PRN

## 2021-09-11 MED ORDER — SIMETHICONE 80 MG PO CHEW: 80.0000 mg | CHEWABLE_TABLET | ORAL | Status: DC | PRN

## 2021-09-11 MED ORDER — LIDOCAINE HCL (PF) 1 % IJ SOLN
INTRAMUSCULAR | Status: DC | PRN
  Administered 2021-09-11: 3 mL via EPIDURAL
  Administered 2021-09-11: 5 mL via EPIDURAL

## 2021-09-11 MED ORDER — PHENYLEPHRINE 40 MCG/ML (10ML) SYRINGE FOR IV PUSH (FOR BLOOD PRESSURE SUPPORT): 80.0000 ug | PREFILLED_SYRINGE | INTRAVENOUS | Status: DC | PRN

## 2021-09-11 MED ORDER — OXYTOCIN-SODIUM CHLORIDE 30-0.9 UT/500ML-% IV SOLN
2.5000 [IU]/h | INTRAVENOUS | Status: DC
  Filled 2021-09-11: qty 500

## 2021-09-11 MED ORDER — LIDOCAINE HCL (PF) 1 % IJ SOLN: 30.0000 mL | INTRAMUSCULAR | Status: DC | PRN

## 2021-09-11 MED ORDER — EPHEDRINE 5 MG/ML INJ: 10.0000 mg | INTRAVENOUS | Status: DC | PRN

## 2021-09-11 MED ORDER — OXYTOCIN-SODIUM CHLORIDE 30-0.9 UT/500ML-% IV SOLN
1.0000 m[IU]/min | INTRAVENOUS | Status: DC
  Administered 2021-09-11: 2 m[IU]/min via INTRAVENOUS

## 2021-09-11 MED ORDER — LACTATED RINGERS IV SOLN: 500.0000 mL | Freq: Once | INTRAVENOUS | Status: DC

## 2021-09-11 MED ORDER — PRENATAL MULTIVITAMIN CH
1.0000 | ORAL_TABLET | Freq: Every day | ORAL | Status: DC
  Administered 2021-09-12 – 2021-09-13 (×2): 1 via ORAL
  Filled 2021-09-11 (×2): qty 1

## 2021-09-11 MED ORDER — ONDANSETRON HCL 4 MG/2ML IJ SOLN: 4.0000 mg | Freq: Four times a day (QID) | INTRAMUSCULAR | Status: DC | PRN

## 2021-09-11 MED ORDER — TERBUTALINE SULFATE 1 MG/ML IJ SOLN: 0.2500 mg | Freq: Once | INTRAMUSCULAR | Status: DC | PRN

## 2021-09-11 MED ORDER — SENNOSIDES-DOCUSATE SODIUM 8.6-50 MG PO TABS
2.0000 | ORAL_TABLET | Freq: Every day | ORAL | Status: DC
  Administered 2021-09-12 – 2021-09-13 (×2): 2 via ORAL
  Filled 2021-09-11 (×2): qty 2

## 2021-09-11 MED ORDER — WITCH HAZEL-GLYCERIN EX PADS: 1.0000 "application " | MEDICATED_PAD | CUTANEOUS | Status: DC | PRN

## 2021-09-11 MED ORDER — ACETAMINOPHEN 325 MG PO TABS: 650.0000 mg | ORAL_TABLET | ORAL | Status: DC | PRN

## 2021-09-11 MED ORDER — ONDANSETRON HCL 4 MG/2ML IJ SOLN: 4.0000 mg | INTRAMUSCULAR | Status: DC | PRN

## 2021-09-11 MED ORDER — ACETAMINOPHEN 325 MG PO TABS
650.0000 mg | ORAL_TABLET | ORAL | Status: DC | PRN
  Administered 2021-09-12 (×2): 650 mg via ORAL
  Filled 2021-09-11 (×2): qty 2

## 2021-09-11 MED ORDER — LACTATED RINGERS IV SOLN: 500.0000 mL | INTRAVENOUS | Status: DC | PRN

## 2021-09-11 MED ORDER — COCONUT OIL OIL: 1.0000 "application " | TOPICAL_OIL | Status: DC | PRN

## 2021-09-11 MED ORDER — LACTATED RINGERS IV SOLN: INTRAVENOUS | Status: DC

## 2021-09-11 MED ORDER — TETANUS-DIPHTH-ACELL PERTUSSIS 5-2.5-18.5 LF-MCG/0.5 IM SUSY: 0.5000 mL | PREFILLED_SYRINGE | Freq: Once | INTRAMUSCULAR | Status: AC

## 2021-09-11 MED ORDER — SOD CITRATE-CITRIC ACID 500-334 MG/5ML PO SOLN: 30.0000 mL | ORAL | Status: DC | PRN

## 2021-09-11 MED ORDER — OXYCODONE-ACETAMINOPHEN 5-325 MG PO TABS: 2.0000 | ORAL_TABLET | ORAL | Status: DC | PRN

## 2021-09-11 MED ORDER — OXYCODONE-ACETAMINOPHEN 5-325 MG PO TABS
1.0000 | ORAL_TABLET | ORAL | Status: DC | PRN
Start: 2021-09-11 — End: 2021-09-11

## 2021-09-11 MED ORDER — FENTANYL-BUPIVACAINE-NACL 0.5-0.125-0.9 MG/250ML-% EP SOLN
12.0000 mL/h | EPIDURAL | Status: DC | PRN
  Administered 2021-09-11: 12 mL/h via EPIDURAL
  Filled 2021-09-11: qty 250

## 2021-09-11 MED ORDER — DIBUCAINE (PERIANAL) 1 % EX OINT: 1.0000 "application " | TOPICAL_OINTMENT | CUTANEOUS | Status: DC | PRN

## 2021-09-11 MED ORDER — DIPHENHYDRAMINE HCL 25 MG PO CAPS: 25.0000 mg | ORAL_CAPSULE | Freq: Four times a day (QID) | ORAL | Status: DC | PRN

## 2021-09-11 NOTE — MAU Note (Signed)
.  Taylor Cochran is a 35 y.o. at [redacted]w[redacted]d here in MAU reporting: possible srom at 2345 last night. Reports DFM today. Is having some ctx she rates 5/10. Deneis VB. AFI low on BPP today.   Vitals:   09/11/21 1038  BP: (!) 144/80  Pulse: 93  Resp: 15  Temp: 98.9 F (37.2 C)

## 2021-09-11 NOTE — Progress Notes (Signed)
Labor Progress Note Taylor Cochran is a 35 y.o. G3P2002 at [redacted]w[redacted]d presented for possible PROM since 11:45pm last night.  S:  Pt comfortable with epidural, repositioning well with FOB at bedside for support.  O:  BP 125/75   Pulse 96   Temp 98.7 F (37.1 C) (Oral)   Resp 15   LMP 12/02/2020   SpO2 98%  EFM: baseline 125 bpm/ moderate variability/ 15x15 accels/ no decels  Toco/IUPC: were initially q30min after AROM, held off on pitocin. Spaced to q3-48min so titration to begin now. SVE: Dilation: 6 Effacement (%): 70 Station: -1 Presentation: Vertex Exam by:: [employer] RN Pitocin: 2 mu/min  A/P: 35 y.o. G3P2002 [redacted]w[redacted]d  1. Labor: Active labor, progressing well on low dose of pitocin and AROM 2. FWB: Cat 1 3. Pain: well controlled with epidural 4. Continue pitocin titration and position changes.  Anticipate SVD.  Bernerd Limbo, CNM 3:16 PM

## 2021-09-11 NOTE — H&P (Signed)
OBSTETRIC ADMISSION HISTORY AND PHYSICAL  Taylor Cochran is a 35 y.o. female G3P2002 with IUP at [redacted]w[redacted]d by LMP and 6 week [place] presenting for SOL. She reports +FMs, No LOF, no VB, no blurry vision, headaches or peripheral edema, and RUQ pain. She plans on breast and bottle feeding. She desires POPs for birth control. She received her prenatal care at  [facility] , prenatal record fully reviewed.  Dating: By LMP and 6 week Korea --->  Estimated Date of Delivery: 09/08/21  Most recent sono @[redacted]w[redacted]d : dating consistent, normal anatomy, cephalic presentation, 2084g, 10-19-1976 EFW  Prenatal History/Complications: AMA, obesity affecting pregnancy, Rubella non-immune, ASCUS with positive HPV  Past Medical History: Past Medical History:  Diagnosis Date   No pertinent past medical history    Vaginal Pap smear, abnormal    Past Surgical History: Past Surgical History:  Procedure Laterality Date   NO PAST SURGERIES     Obstetrical History: OB History     Gravida  3   Para  2   Term  2   Preterm  0   AB  0   Living  2      SAB  0   IAB  0   Ectopic  0   Multiple      Live Births  2          Social History Social History   Socioeconomic History   Marital status: Single    Spouse name: Not on file   Number of children: 2   Years of education: Not on file   Highest education level: Not on file  Occupational History   Not on file  Tobacco Use   Smoking status: Never   Smokeless tobacco: Never  Vaping Use   Vaping Use: Never used  Substance and Sexual Activity   Alcohol use: No   Drug use: No   Sexual activity: Yes  Other Topics Concern   Not on file  Social History Narrative   ** Merged History Encounter **       Social Determinants of Health   Financial Resource Strain: Not on file  Food Insecurity: Not on file  Transportation Needs: Not on file  Physical Activity: Not on file  Stress: Not on file  Social Connections: Not on file   Family History: Family  History  Problem Relation Age of Onset   Diabetes Mother    Allergies: No Known Allergies  Medications Prior to Admission  Medication Sig Dispense Refill Last Dose   aspirin EC 81 MG tablet Take 1 tablet (81 mg total) by mouth daily. Take after 12 weeks for prevention of preeclampsia later in pregnancy 300 tablet 2 09/10/2021   Prenatal Vit-Fe Fumarate-FA (PREPLUS) 27-1 MG TABS Take 1 tablet by mouth daily. 30 tablet 13 09/10/2021   Review of Systems   All systems reviewed and negative except as stated in HPI  Blood pressure (!) 101/58, pulse 75, temperature 98.7 F (37.1 C), temperature source Oral, resp. rate 15, last menstrual period 12/02/2020, SpO2 98 %. General appearance: alert, cooperative, appears stated age, and no distress Lungs: clear to auscultation bilaterally Heart: regular rate and rhythm Abdomen: soft, non-tender; bowel sounds normal Pelvic: normal external female genitalia, adequate for delivery Dilation: 5 Effacement (%): 60 Station: -1 Presentation: Vertex Exam by:: J Leila Schuff CNM Extremities: Homans sign is negative, no sign of DVT DTR's normal Presentation: cephalic Fetal monitoringBaseline: 135 bpm, Variability: Good {> 6 bpm), Accelerations: Reactive, and Decelerations: Absent Uterine activityFrequency: Every 5-7  minutes, Duration: 45-60 seconds, and Intensity: mild Dilation: 5 Effacement (%): 60 Station: -1 Exam by:: Tyler Aas CNM Prenatal labs: ABO, Rh: --/--/O POS (09/26 1105) Antibody: NEG (09/26 1105) Rubella: <0.90 (03/16 1616) RPR: Non Reactive (07/01 0949)  HBsAg: Negative (03/16 1616)  HIV: Non Reactive (07/01 0949)  GBS: Negative/-- (08/26 1013)  1 hr Glucola Normal Genetic screening  Negative Anatomy US Normal  Prenatal Transfer Tool  Maternal Diabetes: No Genetic Screening: Normal Maternal Ultrasounds/Referrals: Normal Fetal Ultrasounds or other Referrals:  None Maternal Substance Abuse:  No Significant Maternal Medications:   None Significant Maternal Lab Results: Group B Strep negative  Results for orders placed or performed during the hospital encounter of 09/11/21 (from the past 24 hour(s))  Comprehensive metabolic panel   Collection Time: 09/11/21 11:03 AM  Result Value Ref Range   Sodium 136 135 - 145 mmol/L   Potassium 3.3 (L) 3.5 - 5.1 mmol/L   Chloride 107 98 - 111 mmol/L   CO2 20 (L) 22 - 32 mmol/L   Glucose, Bld 209 (H) 70 - 99 mg/dL   BUN 5 (L) 6 - 20 mg/dL   Creatinine, Ser 9.37 0.44 - 1.00 mg/dL   Calcium 8.5 (L) 8.9 - 10.3 mg/dL   Total Protein 5.3 (L) 6.5 - 8.1 g/dL   Albumin 2.4 (L) 3.5 - 5.0 g/dL   AST 18 15 - 41 U/L   ALT 19 0 - 44 U/L   Alkaline Phosphatase 280 (H) 38 - 126 U/L   Total Bilirubin 0.8 0.3 - 1.2 mg/dL   GFR, Estimated >16 >96 mL/min   Anion gap 9 5 - 15  CBC   Collection Time: 09/11/21 11:03 AM  Result Value Ref Range   WBC 5.7 4.0 - 10.5 K/uL   RBC 4.08 3.87 - 5.11 MIL/uL   Hemoglobin 12.5 12.0 - 15.0 g/dL   HCT 78.9 38.1 - 01.7 %   MCV 92.2 80.0 - 100.0 fL   MCH 30.6 26.0 - 34.0 pg   MCHC 33.2 30.0 - 36.0 g/dL   RDW 51.0 25.8 - 52.7 %   Platelets 189 150 - 400 K/uL   nRBC 0.0 0.0 - 0.2 %  Type and screen [facility]   Collection Time: 09/11/21 11:05 AM  Result Value Ref Range   ABO/RH(D) O POS    Antibody Screen NEG    Sample Expiration      09/14/2021,2359 Performed at [facility], [address], [place], [place] [address]   Resp Panel by RT-PCR (Flu A&B, Covid) Nasopharyngeal Swab   Collection Time: 09/11/21 11:18 AM   Specimen: Nasopharyngeal Swab; Nasopharyngeal(NP) swabs in vial transport medium  Result Value Ref Range   SARS Coronavirus 2 by RT PCR NEGATIVE NEGATIVE   Influenza A by PCR NEGATIVE NEGATIVE   Influenza B by PCR NEGATIVE NEGATIVE   Patient Active Problem List   Diagnosis Date Noted   Labor and delivery, indication for care 09/11/2021   [redacted] weeks gestation of pregnancy 09/01/2021   [redacted] weeks gestation of  pregnancy 08/16/2021   ASCUS with positive high risk HPV cervical 05/26/2021   Rubella non-immune status, antepartum 03/06/2021   Supervision of other normal pregnancy, antepartum 03/01/2021   Antepartum multigravida of advanced maternal age 24/16/2022   Obesity affecting pregnancy, antepartum 03/01/2021   Breast lump on left side at 10 o'clock position 11/25/2012   Assessment/Plan:  Mary-Ann Pennella is a 35 y.o. G3P2002 at [redacted]w[redacted]d here for possible PROM at  term.  #Labor: latent, will assess for AROM and start pitocin titration #BPs: elevated BP in MAU, one elevated in office followed by normal. CMP added to admission labs and normal #Pain: Planning epidural, suggested placement prior to pitocin start #FWB: Cat 1 #ID:  None #MOF: Breast and formula #MOC: POPs #Circ:  N/A  Bernerd Limbo, CNM  09/11/2021, 3:00 PM

## 2021-09-11 NOTE — MAU Provider Note (Signed)
None      S: Ms. Taylor Cochran is a 35 y.o. G3P2002 at [redacted]w[redacted]d  who presents to MAU today complaining contractions q 5 minutes since 2345 last night. She has some bloody show. She had an [place] earlier today and had an AFI of 6. She reports normal fetal movement.    O: BP 132/68   Pulse 94   Temp 98.9 F (37.2 C) (Oral)   Resp 15   LMP 12/02/2020   SpO2 98%  GENERAL: Well-developed, well-nourished female in no acute distress.  HEAD: Normocephalic, atraumatic.  CHEST: Normal effort of breathing, regular heart rate ABDOMEN: Soft, nontender, gravid  Cervical exam:      Fetal Monitoring: Baseline: 130s Variability: mod Accelerations: ++ Decelerations: none Contractions: every 5 minutes   A: SIUP at [redacted]w[redacted]d  Not in active labor, but had an elevated BP.   P: Admit, will augment.  Levie Heritage, DO 09/11/2021 11:03 AM

## 2021-09-11 NOTE — Discharge Summary (Addendum)
Postpartum Discharge Summary  Date of Service updated 09/13/21     Patient Name: Taylor Cochran DOB: 01/26/1986 MRN: [id number]  Date of admission: 09/11/2021 Delivery date:09/11/2021  Delivering provider: Jim Like B  Date of discharge: 09/13/2021  Admitting diagnosis: Labor and delivery, indication for care [O75.9] Intrauterine pregnancy: [redacted]w[redacted]d    Secondary diagnosis:  Active Problems:   Labor and delivery, indication for care  Additional problems: None    Discharge diagnosis: Term Pregnancy Delivered                                              Post partum procedures: None Augmentation: Pitocin Complications: None  Hospital course: Onset of Labor With Vaginal Delivery      35y.o. yo G3P2002 at 436w3das admitted in Latent Labor on 09/11/2021. Patient had an uncomplicated labor course as follows:  Membrane Rupture Time/Date: 12:07 PM ,09/11/2021   Delivery Method:Vaginal, Spontaneous  Episiotomy: None  Lacerations:  None  Patient had an uncomplicated postpartum course.  She is ambulating, tolerating a regular diet, passing flatus, and urinating well. Patient is discharged home in stable condition on 09/13/21.  Newborn Data: Birth date:09/11/2021  Birth time:7:46 PM  Gender:Female  Living status:Living  Apgars:8 ,9  Weight:3549 g   Magnesium Sulfate received: No BMZ received: No Rhophylac:N/A MMR:N/A T-DaP:Given prenatally Flu: N/A Transfusion:No  Physical exam  Vitals:   09/12/21 1033 09/12/21 1527 09/12/21 2304 09/13/21 0541  BP: 126/76 104/72 117/77 109/72  Pulse: 84 77 67 67  Resp: _0 Temp: 98.4 F (36.9 C) 98.2 F (36.8 C) 97.6 F (36.4 C) 98.6 F (37 C)  TempSrc: Oral Oral Oral Oral  SpO2: 100%  100%    General: alert, cooperative, and no distress Lochia: appropriate Uterine Fundus: firm Incision: N/A DVT Evaluation: No evidence of DVT seen on physical exam. No cords or calf tenderness. Labs: Lab Results  Component Value  Date   WBC 8.4 09/12/2021   HGB 11.6 (L) 09/12/2021   HCT 34.4 (L) 09/12/2021   MCV 90.8 09/12/2021   PLT 168 09/12/2021   CMP Latest Ref Rng & Units 09/11/2021  Glucose 70 - 99 mg/dL 209(H)  BUN 6 - 20 mg/dL 5(L)  Creatinine 0.44 - 1.00 mg/dL 0.48  Sodium 135 - 145 mmol/L 136  Potassium 3.5 - 5.1 mmol/L 3.3(L)  Chloride 98 - 111 mmol/L 107  CO2 22 - 32 mmol/L 20(L)  Calcium 8.9 - 10.3 mg/dL 8.5(L)  Total Protein 6.5 - 8.1 g/dL 5.3(L)  Total Bilirubin 0.3 - 1.2 mg/dL 0.8  Alkaline Phos 38 - 126 U/L 280(H)  AST 15 - 41 U/L 18  ALT 0 - 44 U/L 19   Edinburgh Score: Edinburgh Postnatal Depression Scale Screening Tool 09/12/2021  I have been able to laugh and see the funny side of things. 0  I have looked forward with enjoyment to things. 0  I have blamed myself unnecessarily when things went wrong. 0  I have been anxious or worried for no good reason. 0  I have felt scared or panicky for no good reason. 0  Things have been getting on top of me. 0  I have been so unhappy that I have had difficulty sleeping. 0  I have felt sad or miserable. 0  I have been so unhappy that I have been crying. 0  The  thought of harming myself has occurred to me. 0  Edinburgh Postnatal Depression Scale Total 0     After visit meds:  Allergies as of 09/13/2021   No Known Allergies      Medication List     STOP taking these medications    aspirin EC 81 MG tablet       TAKE these medications    acetaminophen 325 MG tablet Commonly known as: Tylenol Take 2 tablets (650 mg total) by mouth every 4 (four) hours as needed (for pain scale < 4).   ibuprofen 200 MG tablet Commonly known as: ADVIL Take 3 tablets (600 mg total) by mouth every 6 (six) hours.   PrePLUS 27-1 MG Tabs Take 1 tablet by mouth daily.   Slynd 4 MG Tabs Generic drug: Drospirenone Take 1 tablet by mouth daily.         Discharge home in stable condition Infant Feeding: Bottle and Breast Infant Disposition:home  with mother Discharge instruction: per After Visit Summary and Postpartum booklet. Activity: Advance as tolerated. Pelvic rest for 6 weeks.  Diet: routine diet Future Appointments: Future Appointments  Date Time Provider [place]  10/10/2021  1:10 PM Griffin Basil, MD [place] None   Follow up Visit:   Please schedule this patient for a In person postpartum visit in 4 weeks with the following provider: Any provider. Additional Postpartum F/U: None   Low risk pregnancy complicated by:  None Delivery mode:  Vaginal, Spontaneous  Anticipated Birth Control:  POPs   09/13/2021 Mayer Masker, PGY-1, Faculty Service  Midwife attestation I have seen and examined this patient and agree with above documentation in the resident's note.   Taylor Cochran is a 35 y.o. G9M2111 s/p vaginal delivery.   Pain is well controlled.  Plan for birth control is  POPs, possibly Depo or LARC at [facility] visit in office .  Method of Feeding: breast  PE:  BP 109/72 (BP Location: Left Arm)   Pulse 67   Temp 98.6 F (37 C) (Oral)   Resp 18   LMP 12/02/2020   SpO2 100%   Breastfeeding Unknown  Gen: well appearing Heart: reg rate Lungs: normal WOB Fundus firm Ext: soft, no pain, no edema  Recent Labs    09/11/21 1103 09/12/21 0449  HGB 12.5 11.6*  HCT 37.6 34.4*     Plan: discharge today - postpartum care discussed - f/u clinic in 4-6 weeks for postpartum visit   Fatima Blank, CNM 8:51 AM

## 2021-09-11 NOTE — Anesthesia Preprocedure Evaluation (Signed)
Anesthesia Evaluation  Patient identified by MRN, date of birth, ID band Patient awake    Reviewed: Allergy & Precautions, NPO status , Patient's Chart, lab work & pertinent test results  Airway Mallampati: II  TM Distance: >3 FB Neck ROM: Full    Dental  (+) Teeth Intact   Pulmonary neg pulmonary ROS,    Pulmonary exam normal        Cardiovascular negative cardio ROS   Rhythm:Regular Rate:Normal     Neuro/Psych negative neurological ROS  negative psych ROS   GI/Hepatic negative GI ROS, Neg liver ROS,   Endo/Other  negative endocrine ROS  Renal/GU negative Renal ROS  negative genitourinary   Musculoskeletal negative musculoskeletal ROS (+)   Abdominal (+)  Abdomen: soft.    Peds  Hematology negative hematology ROS (+)   Anesthesia Other Findings   Reproductive/Obstetrics (+) Pregnancy                             Anesthesia Physical Anesthesia Plan  ASA: 2  Anesthesia Plan: Epidural   Post-op Pain Management:    Induction:   PONV Risk Score and Plan: 2 and Treatment may vary due to age or medical condition  Airway Management Planned: Natural Airway  Additional Equipment: None  Intra-op Plan:   Post-operative Plan:   Informed Consent: I have reviewed the patients History and Physical, chart, labs and discussed the procedure including the risks, benefits and alternatives for the proposed anesthesia with the patient or authorized representative who has indicated his/her understanding and acceptance.     Dental advisory given  Plan Discussed with:   Anesthesia Plan Comments: (Lab Results      Component                Value               Date                      WBC                      5.7                 09/11/2021                HGB                      12.5                09/11/2021                HCT                      37.6                09/11/2021                 MCV                      92.2                09/11/2021                PLT                      189  09/11/2021          )        Anesthesia Quick Evaluation

## 2021-09-11 NOTE — Anesthesia Procedure Notes (Signed)
Epidural Patient location during procedure: OB Start time: 09/11/2021 12:56 PM End time: 09/11/2021 1:02 PM  Staffing Anesthesiologist: Atilano Median, DO Performed: anesthesiologist   Preanesthetic Checklist Completed: patient identified, IV checked, site marked, risks and benefits discussed, surgical consent, monitors and equipment checked, pre-op evaluation and timeout performed  Epidural Patient position: sitting Prep: ChloraPrep Patient monitoring: heart rate, continuous pulse ox and blood pressure Approach: midline Location: L3-L4 Injection technique: LOR saline  Needle:  Needle type: Tuohy  Needle gauge: 17 G Needle length: 9 cm Needle insertion depth: 7 cm Catheter type: closed end flexible Catheter size: 20 Guage Catheter at skin depth: 11 cm Test dose: negative and 1.5% lidocaine  Assessment Events: blood not aspirated, injection not painful, no injection resistance and no paresthesia  Additional Notes Patient identified. Risks/Benefits/Options discussed with patient including but not limited to bleeding, infection, nerve damage, paralysis, failed block, incomplete pain control, headache, blood pressure changes, nausea, vomiting, reactions to medications, itching and postpartum back pain. Confirmed with bedside nurse the patient's most recent platelet count. Confirmed with patient that they are not currently taking any anticoagulation, have any bleeding history or any family history of bleeding disorders. Patient expressed understanding and wished to proceed. All questions were answered. Sterile technique was used throughout the entire procedure. Please see nursing notes for vital signs. Test dose was given through epidural catheter and negative prior to continuing to dose epidural or start infusion. Warning signs of high block given to the patient including shortness of breath, tingling/numbness in hands, complete motor block, or any concerning symptoms with  instructions to call for help. Patient was given instructions on fall risk and not to get out of bed. All questions and concerns addressed with instructions to call with any issues or inadequate analgesia.    Reason for block:procedure for pain

## 2021-09-11 NOTE — Progress Notes (Signed)
Pt informed that the ultrasound is considered a limited OB ultrasound and is not intended to be a complete ultrasound exam.  Patient also informed that the ultrasound is not being completed with the intent of assessing for fetal or placental anomalies or any pelvic abnormalities.  Explained that the purpose of today's ultrasound is to assess for presentation, BPP and amniotic fluid volume.  Patient acknowledges the purpose of the exam and the limitations of the study.    Pt states she has had clear fluid leaking since 11:45 pm last night. She began feeling UC's @ 0200 which are becoming more frequent and stronger. AFI is 6.7 and subjectively low. Pt is having UC's q2-4 minutes during NST which are mild/moderate.  Due to the likelihood that she has SROM, pt was advised to go to MAU for evaluation. She voiced understanding.

## 2021-09-12 LAB — CBC
HCT: 34.4 % — ABNORMAL LOW (ref 36.0–46.0)
Hemoglobin: 11.6 g/dL — ABNORMAL LOW (ref 12.0–15.0)
MCH: 30.6 pg (ref 26.0–34.0)
MCHC: 33.7 g/dL (ref 30.0–36.0)
MCV: 90.8 fL (ref 80.0–100.0)
Platelets: 168 10*3/uL (ref 150–400)
RBC: 3.79 MIL/uL — ABNORMAL LOW (ref 3.87–5.11)
RDW: 13 % (ref 11.5–15.5)
WBC: 8.4 10*3/uL (ref 4.0–10.5)
nRBC: 0 % (ref 0.0–0.2)

## 2021-09-12 LAB — RPR: RPR Ser Ql: NONREACTIVE

## 2021-09-12 NOTE — Progress Notes (Signed)
Patient was assessed and managed by nursing staff during this encounter. I have reviewed the chart and agree with the documentation and plan. I have also made any necessary editorial changes.  Warden Fillers, MD 09/12/2021 8:26 AM

## 2021-09-12 NOTE — Progress Notes (Signed)
Taylor Cochran  Post Partum Day One:S/P SVD   Subjective: Patient up ad lib, denies syncope or dizziness. Reports consuming regular diet without issues and denies N/V. Denies issues with urination and reports bleeding is "okay."  Patient is breastfeeding and reports going well.  Desires POPs for postpartum contraception.  Pain is being appropriately managed with use of ibuprofen and tylenol.  Objective: Vitals:   09/11/21 2101 09/11/21 2135 09/11/21 2235 09/12/21 0235  BP: 129/85 136/73 121/77 111/65  Pulse: 94 75 70 72  Resp:  18 18 18   Temp:  98.4 F (36.9 C) 98.1 F (36.7 C) 99 F (37.2 C)  TempSrc:  Oral Oral Oral  SpO2:  98% 98% 98%   Recent Labs    09/11/21 1103 09/12/21 0449  HGB 12.5 11.6*  HCT 37.6 34.4*    Physical Exam:  General: alert, cooperative, and no distress Mood/Affect: Appropriate/Bright Lungs: clear to auscultation, no wheezes, rales or rhonchi, symmetric air entry.  Heart: normal rate and regular rhythm. Breast: Not examined. Abdomen:  + bowel sounds, Soft, NT Uterine Fundus: firm, U/-4 Lochia: appropriate Laceration: N/A Skin: Warm, Dry DVT Evaluation: No evidence of DVT seen on physical exam. Calf/Ankle edema is present.  Assessment S/P Vaginal Delivery-Day One Normal [facility] BreastFeeding  Plan: -Discharge planning and teaching performed. -Informed that if she desires discharge it would have to be after 2000 and with peds approval. -Encouraged continue usage of medications to manage pain.  -Continue current care -L&D Team to be updated on patient status   09/14/21, MSN, CNM 09/12/2021, 6:09 AM

## 2021-09-12 NOTE — Anesthesia Postprocedure Evaluation (Signed)
Anesthesia Post Note  Patient: Taylor Cochran  Procedure(s) Performed: AN AD HOC LABOR EPIDURAL     Patient location during evaluation: Mother Baby Anesthesia Type: Epidural Level of consciousness: awake and alert Pain management: pain level controlled Vital Signs Assessment: post-procedure vital signs reviewed and stable Respiratory status: spontaneous breathing, nonlabored ventilation and respiratory function stable Cardiovascular status: stable Postop Assessment: no headache, no backache, epidural receding, no apparent nausea or vomiting, patient able to bend at knees, adequate PO intake and able to ambulate Anesthetic complications: no   No notable events documented.  Last Vitals:  Vitals:   09/12/21 0235 09/12/21 0635  BP: 111/65 110/67  Pulse: 72 65  Resp: 18 18  Temp: 37.2 C 37 C  SpO2: 98% 98%    Last Pain:  Vitals:   09/12/21 0635  TempSrc: Oral  PainSc:    Pain Goal:                   Laban Emperor

## 2021-09-13 MED ORDER — IBUPROFEN 200 MG PO TABS: 600.0000 mg | ORAL_TABLET | Freq: Four times a day (QID) | ORAL | Status: AC

## 2021-09-13 MED ORDER — SLYND 4 MG PO TABS: 1.0000 | ORAL_TABLET | Freq: Every day | ORAL | 11 refills | Status: DC

## 2021-09-13 MED ORDER — ACETAMINOPHEN 325 MG PO TABS: 650.0000 mg | ORAL_TABLET | ORAL | Status: AC | PRN

## 2021-09-13 MED ORDER — NORETHINDRONE 0.35 MG PO TABS: 1.0000 | ORAL_TABLET | Freq: Every day | ORAL | 11 refills | Status: AC

## 2021-09-15 ENCOUNTER — Inpatient Hospital Stay (HOSPITAL_COMMUNITY): Admission: AD | Admit: 2021-09-15 | Payer: Medicaid Other | Source: Home / Self Care | Admitting: Family Medicine

## 2021-09-15 ENCOUNTER — Inpatient Hospital Stay (HOSPITAL_COMMUNITY): Payer: Medicaid Other

## 2021-09-21 ENCOUNTER — Telehealth (HOSPITAL_COMMUNITY): Payer: Self-pay | Admitting: *Deleted

## 2021-09-21 NOTE — Telephone Encounter (Signed)
Patient voiced no questions or concerns regarding her own health. EPDS = 8. Patient stated that she's been "crying for no reason." RN discussed baby blues with patient. Offered to email mental health resources to patient - patient declined. Encouraged patient to contact her OB if she doesn't begin to feel more like herself over the next week. Patient verbalized understanding. Patient voiced no questions or concerns regarding baby at this time. Patient reported infant sleeps in a bassinet sometimes and sometimes in her bed. Also stated that infant sleeps on her back and on her side.RN reviewed ABCs of safe sleep. Discussed safe sleep place and safe sleep position - patient verbalized understanding. Patient requested RN email information on hospital's virtual postpartum classes and support groups. Email sent. Deforest Hoyles, RN, 09/21/21, [phone]

## 2021-10-10 ENCOUNTER — Other Ambulatory Visit (HOSPITAL_COMMUNITY)
Admission: RE | Admit: 2021-10-10 | Discharge: 2021-10-10 | Disposition: A | Discharge status: Home / Self Care | Payer: Medicaid Other | Source: Ambulatory Visit | Attending: Obstetrics & Gynecology | Admitting: Obstetrics & Gynecology

## 2021-10-10 ENCOUNTER — Ambulatory Visit (INDEPENDENT_AMBULATORY_CARE_PROVIDER_SITE_OTHER): Payer: Medicaid Other | Admitting: Obstetrics & Gynecology

## 2021-10-10 ENCOUNTER — Other Ambulatory Visit: Payer: Self-pay

## 2021-10-10 ENCOUNTER — Encounter: Payer: Self-pay | Admitting: Obstetrics & Gynecology

## 2021-10-10 VITALS — BP 123/86 | HR 81 | Ht 62.0 in | Wt 191.7 lb

## 2021-10-10 DIAGNOSIS — Z01812 Encounter for preprocedural laboratory examination: Secondary | ICD-10-CM

## 2021-10-10 DIAGNOSIS — R87610 Atypical squamous cells of undetermined significance on cytologic smear of cervix (ASC-US): Secondary | ICD-10-CM | POA: Diagnosis present

## 2021-10-10 DIAGNOSIS — R87810 Cervical high risk human papillomavirus (HPV) DNA test positive: Secondary | ICD-10-CM | POA: Insufficient documentation

## 2021-10-10 LAB — POCT URINE PREGNANCY: Preg Test, Ur: NEGATIVE

## 2021-10-10 NOTE — Progress Notes (Signed)
Patient given informed consent, signed copy in the chart, time out was performed.  Placed in lithotomy position. Cervix viewed with speculum and colposcope after application of acetic acid.  ASCUS pos HR HPV Colposcopy adequate?  yes Acetowhite lesions?yes at 3-6 Punctation?no Mosaicism?  no Abnormal vasculature?  no Biopsies?yes at 11 and 6 ECC?yes  COMMENTS: Patient was given post procedure instructions.   Scheryl Darter, MD

## 2021-10-10 NOTE — Progress Notes (Signed)
Post Partum Visit Note  Taylor Cochran is a 35 y.o. [id number] female who presents for a postpartum visit. She is 4 weeks postpartum following a normal spontaneous vaginal delivery.  I have fully reviewed the prenatal and intrapartum course. The delivery was at 40 gestational weeks.  Anesthesia: epidural. Postpartum course has been uncomplicated . Baby is doing well. Baby is feeding by breast. Bleeding staining only. Bowel function is normal. Bladder function is normal. Patient is not sexually active. Contraception method is oral progesterone-only contraceptive. Postpartum depression screening: negative.   The pregnancy intention screening data noted above was reviewed. Potential methods of contraception were discussed. The patient elected to proceed with No data recorded. POP  Edinburgh Postnatal Depression Scale - 10/10/21 1314       Edinburgh Postnatal Depression Scale:  In the Past 7 Days   I have been able to laugh and see the funny side of things. 0    I have looked forward with enjoyment to things. 0    I have blamed myself unnecessarily when things went wrong. 0    I have been anxious or worried for no good reason. 2    I have felt scared or panicky for no good reason. 0    Things have been getting on top of me. 2    I have been so unhappy that I have had difficulty sleeping. 0    I have felt sad or miserable. 1    I have been so unhappy that I have been crying. 0    The thought of harming myself has occurred to me. 0    Edinburgh Postnatal Depression Scale Total 5             Health Maintenance Due  Topic Date Due   COVID-19 Vaccine (1) Never done   INFLUENZA VACCINE  Never done    The following portions of the patient's history were reviewed and updated as appropriate: allergies, current medications, past family history, past medical history, past social history, past surgical history, and problem list.  Review of Systems Pertinent items are noted in  HPI.  Objective:  BP 123/86   Pulse 81   Ht 5\' 2"  (1.575 m)   Wt 191 lb 11.2 oz (87 kg)   Breastfeeding Yes   BMI 35.06 kg/m    General:  alert, cooperative, and no distress   Breasts:    Lungs:   Heart:  regular rate and rhythm  Abdomen: soft, non-tender; bowel sounds normal; no masses,  no organomegaly   Wound   GU exam:  normal       Assessment:   ASCUS with positive high risk HPV cervical  Postpartum state   normal postpartum exam.   Plan:   Essential components of care per ACOG recommendations:  1.  Mood and well being: Patient with negative depression screening today. Reviewed local resources for support.  - Patient tobacco use? No.   - hx of drug use? No.    2. Infant care and feeding:  -Patient currently breastmilk feeding? No.  -Social determinants of health (SDOH) reviewed in EPIC. No concerns  3. Sexuality, contraception and birth spacing - Patient does not want a pregnancy in the next year.  Desired family size is 3 children.  - Reviewed forms of contraception in tiered fashion. Patient desired oral progesterone-only contraceptive today.   - Discussed birth spacing of 18 months  4. Sleep and fatigue -Encouraged family/partner/community support of 4 hrs of uninterrupted sleep  to help with mood and fatigue  5. Physical Recovery  - Discussed patients delivery and complications. She describes her labor as good. - Patient had a Vaginal, no problems at delivery. Patient had no laceration.  - Patient has urinary incontinence? No. - Patient is safe to resume physical and sexual activity  6.  Health Maintenance - HM due items addressed Yes - Last pap smear  Diagnosis  Date Value Ref Range Status  03/01/2021 (A)  Final   - Atypical squamous cells of undetermined significance (ASC-US)   Pap smear not done at today's visit.  -Breast Cancer screening indicated? No.   7. Chronic Disease/Pregnancy Condition follow up: Abnormal pap smear Colposcopy done -  PCP follow up  Scheryl Darter, MD [facility]

## 2021-10-13 LAB — SURGICAL PATHOLOGY

## 2021-10-24 ENCOUNTER — Encounter: Payer: Self-pay | Admitting: *Deleted

## 2021-10-24 NOTE — Progress Notes (Signed)
TC to patient to notify of need for LEEP. Explanation offered. Patient verbalized understanding. MyChart education on procedure sent with patient's permission. Call transferred to front office to schedule procedure.

## 2021-11-21 ENCOUNTER — Other Ambulatory Visit: Payer: Self-pay

## 2021-11-21 ENCOUNTER — Encounter: Payer: Self-pay | Admitting: Obstetrics & Gynecology

## 2021-11-21 ENCOUNTER — Other Ambulatory Visit (HOSPITAL_COMMUNITY)
Admission: RE | Admit: 2021-11-21 | Discharge: 2021-11-21 | Disposition: A | Discharge status: Home / Self Care | Payer: Medicaid Other | Source: Ambulatory Visit | Attending: Obstetrics & Gynecology | Admitting: Obstetrics & Gynecology

## 2021-11-21 ENCOUNTER — Ambulatory Visit (INDEPENDENT_AMBULATORY_CARE_PROVIDER_SITE_OTHER): Payer: Medicaid Other | Admitting: Obstetrics & Gynecology

## 2021-11-21 VITALS — BP 131/83 | HR 79 | Ht 62.0 in | Wt 196.4 lb

## 2021-11-21 DIAGNOSIS — Z01812 Encounter for preprocedural laboratory examination: Secondary | ICD-10-CM | POA: Diagnosis not present

## 2021-11-21 DIAGNOSIS — N871 Moderate cervical dysplasia: Secondary | ICD-10-CM

## 2021-11-21 LAB — POCT URINE PREGNANCY: Preg Test, Ur: NEGATIVE

## 2021-11-21 NOTE — Progress Notes (Signed)
Patient identified, informed consent obtained, signed copy in chart, time out performed.  Pap smear and colposcopy reviewed.   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  Cervix visualized with colposcope and Lugol's solution applied. Paracervical block placed.  Medium Fischer loop used to remove cone of cervix using blend of cut and cautery on LEEP machine.  Edges/Base cauterized with ball.  Monsel's solution used for hemostasis.  Patient tolerated procedure well.  Patient given post procedure instructions.  Follow up in 12 months for repeat pap or as needed. Patient ID: Taylor Cochran, female   DOB: 07-06-1986, 35 y.o.   MRN: [id number]

## 2021-11-21 NOTE — Progress Notes (Signed)
Patient presents LEEP procedure for CIN 2. Patient has no other concerns today.

## 2021-11-23 LAB — SURGICAL PATHOLOGY

## 2022-01-04 ENCOUNTER — Encounter: Payer: Self-pay | Admitting: Obstetrics & Gynecology

## 2022-09-08 IMAGING — US US FETAL BPP W/ NON-STRESS
1 series · 14 of 14 positions shown · non-contrast
Comparison: none

[Series 1: us fetal bpp w/ non-stress · 14 acquisitions, 14 frames shown]
[im 1/14]
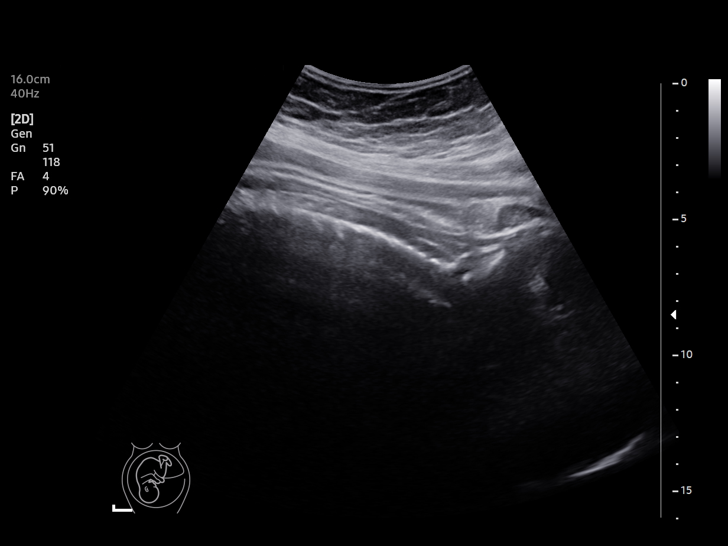
[im 2/14]
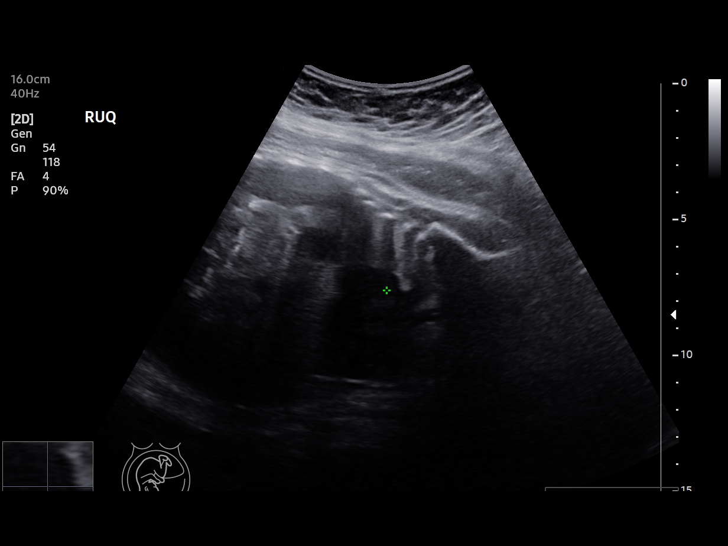
[im 3/14]
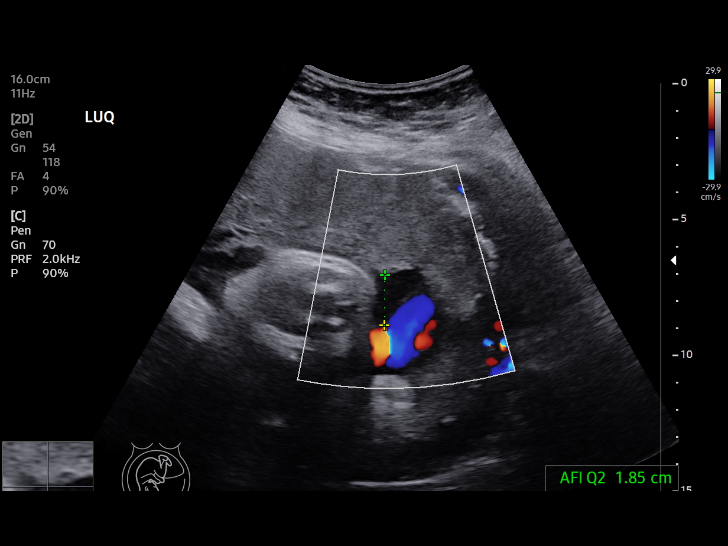
[im 4/14]
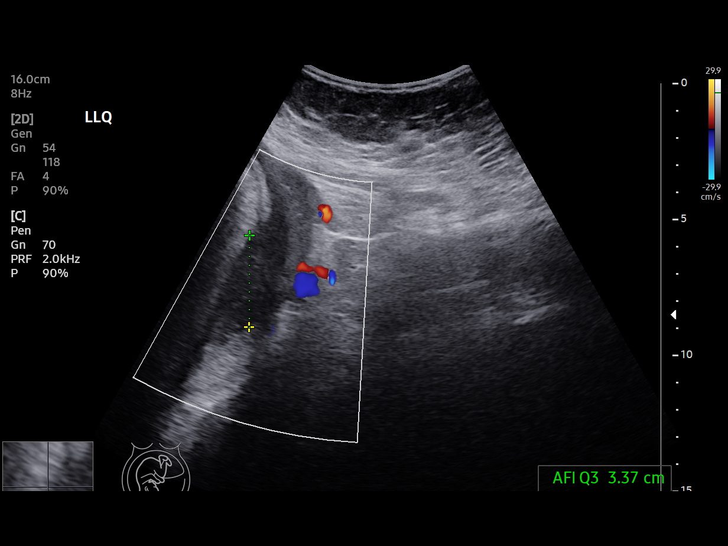
[im 5/14]
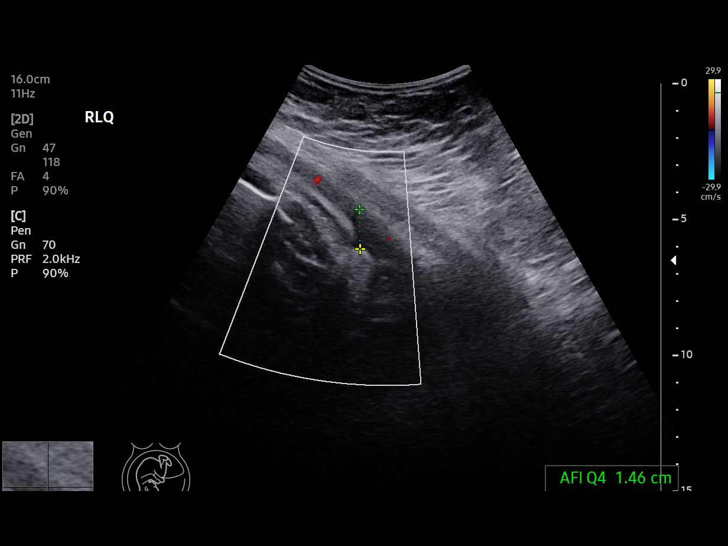
[im 6/14]
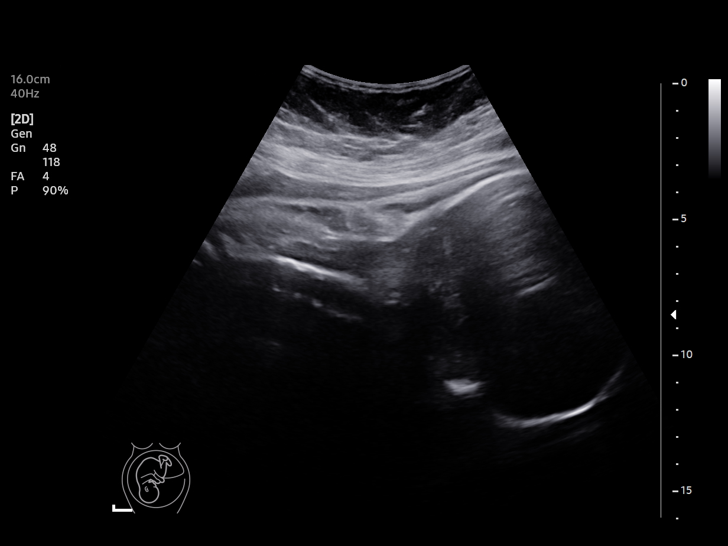
[im 7/14]
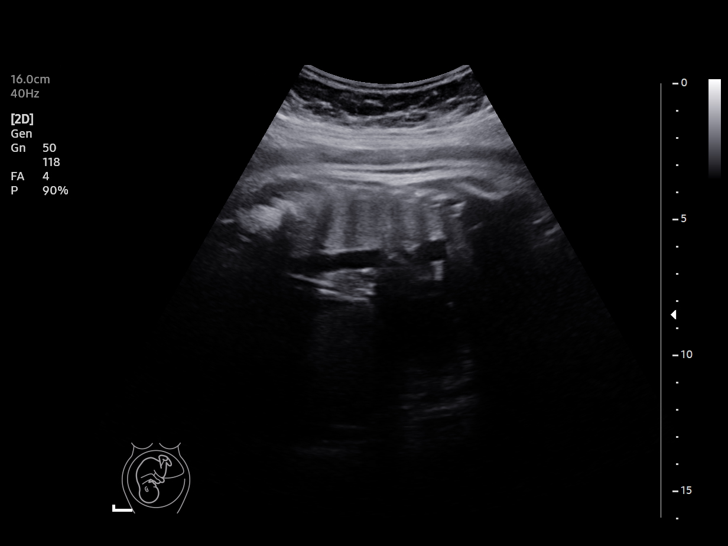
[im 8/14]
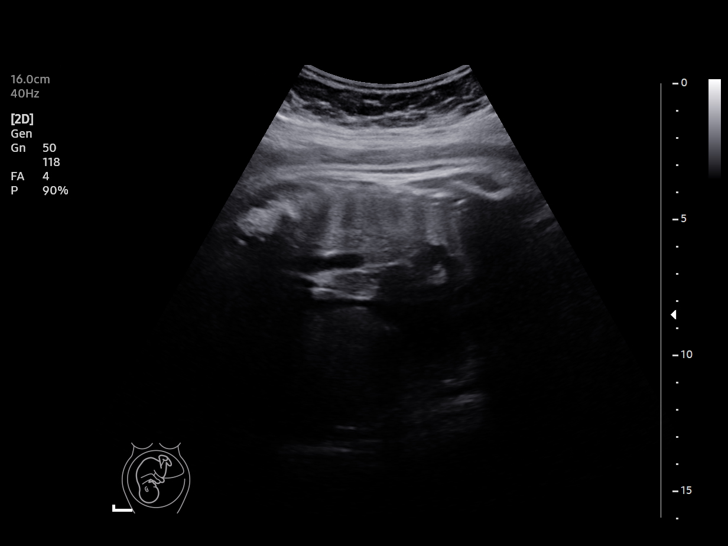
[im 9/14]
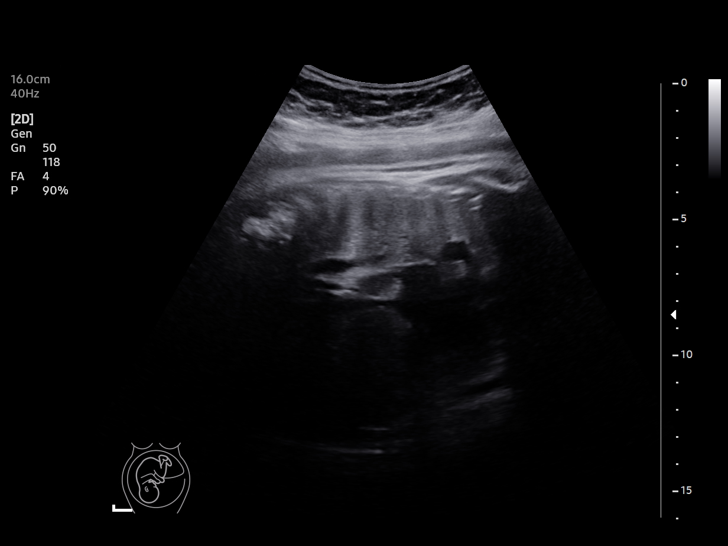
[im 10/14]
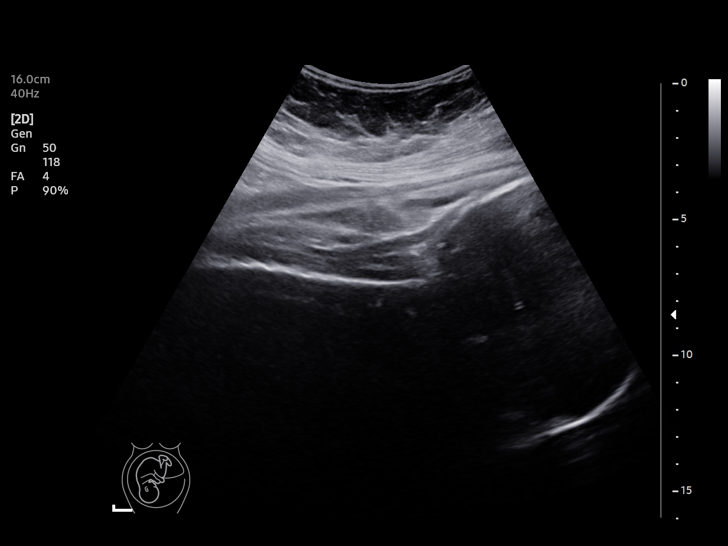
[im 11/14]
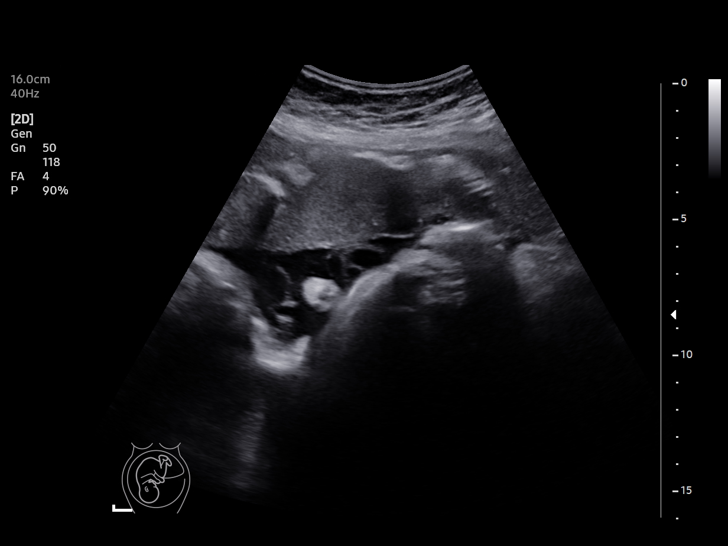
[im 12/14]
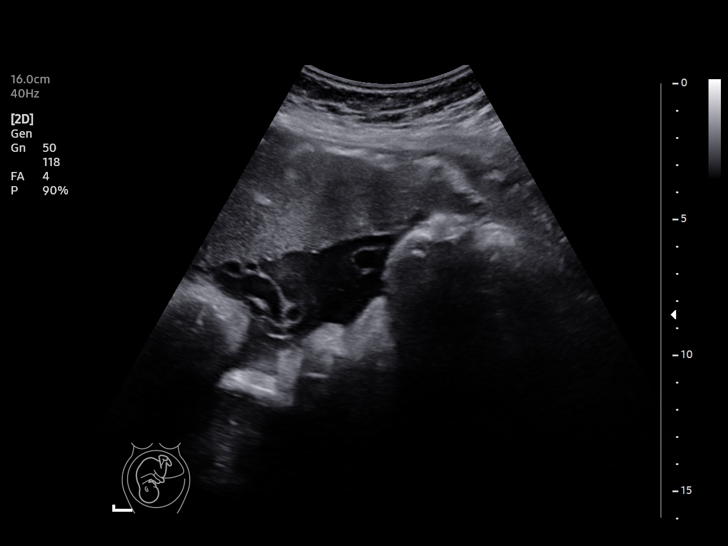
[im 13/14]
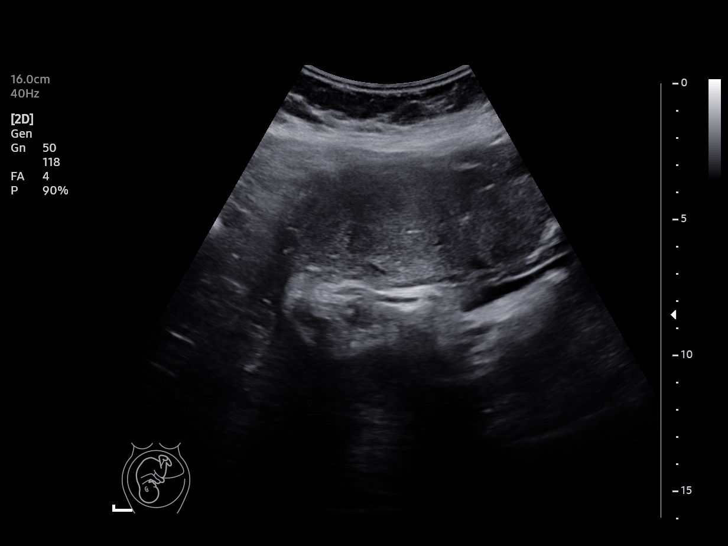
[im 14/14]
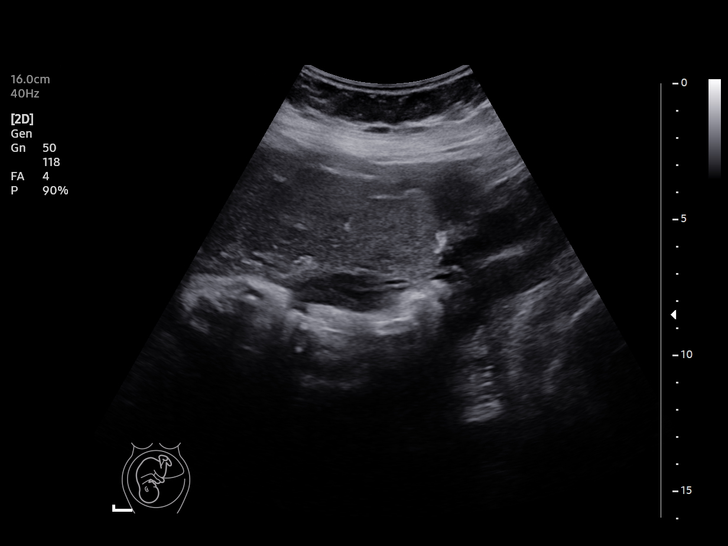

[14 of 14 positions shown; findings below may reference images not displayed]

[REDACTED]
                                                              [REDACTED] at

Service(s) Provided

Indications

 40 weeks gestation of pregnancy
 Postdate pregnancy (40-42 weeks)
Fetal Evaluation

 Num Of Fetuses:          1
 Preg. Location:          Intrauterine
 Cardiac Activity:        Observed
 Presentation:            Cephalic

 Amniotic Fluid
 AFI FV:      Subjectively low-normal

 AFI Sum(cm)     %Tile       Largest Pocket(cm)
 6.68            4

 RUQ(cm)       RLQ(cm)        LUQ(cm)        LLQ(cm)
 0

 Comment:     [DATE] BPP noted
Biophysical Evaluation

 Amniotic F.V:   Pocket => 2 cm              F. Tone:         Observed
 F. Movement:    Observed                    N.S.T:           Reactive
 F. Breathing:   Observed                    Score:           [DATE]
OB History

 Gravidity:     3         Term:  2          Prem:  0        SAB:   0
 TOP:           0       Ectopic: 0         Living: 2
Gestational Age

 LMP:            40w 3d       Date:  12/02/20                   EDD:  09/08/21
 Best:           40w 3d    Det. By:  LMP  (12/02/20)            EDD:  09/08/21
